# Patient Record
Sex: Male | Born: 1950 | Race: White | Hispanic: No | Marital: Married | Smoking: Never smoker
Health system: Southern US, Community
[De-identification: ages and names within clinical notes are randomized; demographics above are authoritative.]

## PROBLEM LIST (undated history)

## (undated) DIAGNOSIS — C439 Malignant melanoma of skin, unspecified: Secondary | ICD-10-CM

## (undated) DIAGNOSIS — R569 Unspecified convulsions: Secondary | ICD-10-CM

## (undated) DIAGNOSIS — K602 Anal fissure, unspecified: Secondary | ICD-10-CM

## (undated) DIAGNOSIS — E119 Type 2 diabetes mellitus without complications: Secondary | ICD-10-CM

## (undated) DIAGNOSIS — C801 Malignant (primary) neoplasm, unspecified: Secondary | ICD-10-CM

## (undated) DIAGNOSIS — M1990 Unspecified osteoarthritis, unspecified site: Secondary | ICD-10-CM

## (undated) DIAGNOSIS — I10 Essential (primary) hypertension: Secondary | ICD-10-CM

## (undated) DIAGNOSIS — E785 Hyperlipidemia, unspecified: Secondary | ICD-10-CM

## (undated) DIAGNOSIS — G40909 Epilepsy, unspecified, not intractable, without status epilepticus: Secondary | ICD-10-CM

## (undated) HISTORY — PX: OTHER SURGICAL HISTORY: SHX169

## (undated) HISTORY — PX: ANAL FISSURE REPAIR: SHX2312

## (undated) HISTORY — DX: Malignant melanoma of skin, unspecified: C43.9

---

## 2004-07-10 ENCOUNTER — Ambulatory Visit: Payer: Self-pay

## 2007-09-21 ENCOUNTER — Ambulatory Visit: Payer: Self-pay | Admitting: Internal Medicine

## 2013-08-13 DIAGNOSIS — G40909 Epilepsy, unspecified, not intractable, without status epilepticus: Secondary | ICD-10-CM | POA: Insufficient documentation

## 2013-08-13 DIAGNOSIS — E785 Hyperlipidemia, unspecified: Secondary | ICD-10-CM | POA: Insufficient documentation

## 2013-08-13 DIAGNOSIS — K603 Anal fistula: Secondary | ICD-10-CM | POA: Insufficient documentation

## 2013-08-13 DIAGNOSIS — I10 Essential (primary) hypertension: Secondary | ICD-10-CM | POA: Insufficient documentation

## 2013-08-13 DIAGNOSIS — D649 Anemia, unspecified: Secondary | ICD-10-CM | POA: Insufficient documentation

## 2014-06-27 DIAGNOSIS — K625 Hemorrhage of anus and rectum: Secondary | ICD-10-CM | POA: Diagnosis not present

## 2014-06-27 DIAGNOSIS — K603 Anal fistula: Secondary | ICD-10-CM | POA: Diagnosis not present

## 2014-06-28 DIAGNOSIS — E119 Type 2 diabetes mellitus without complications: Secondary | ICD-10-CM | POA: Diagnosis not present

## 2014-07-11 ENCOUNTER — Ambulatory Visit: Payer: Self-pay | Admitting: Surgery

## 2014-07-11 DIAGNOSIS — Z01812 Encounter for preprocedural laboratory examination: Secondary | ICD-10-CM | POA: Diagnosis not present

## 2014-07-11 DIAGNOSIS — I10 Essential (primary) hypertension: Secondary | ICD-10-CM | POA: Diagnosis not present

## 2014-07-11 DIAGNOSIS — Z01810 Encounter for preprocedural cardiovascular examination: Secondary | ICD-10-CM | POA: Diagnosis not present

## 2014-07-11 LAB — BASIC METABOLIC PANEL
Anion Gap: 7 (ref 7–16)
BUN: 20 mg/dL — ABNORMAL HIGH (ref 7–18)
CHLORIDE: 103 mmol/L (ref 98–107)
CO2: 28 mmol/L (ref 21–32)
CREATININE: 1.07 mg/dL (ref 0.60–1.30)
Calcium, Total: 8.9 mg/dL (ref 8.5–10.1)
EGFR (Non-African Amer.): >60
Glucose: 119 mg/dL — ABNORMAL HIGH (ref 65–99)
Osmolality: 279 (ref 275–301)
Potassium: 4 mmol/L (ref 3.5–5.1)
SODIUM: 138 mmol/L (ref 136–145)

## 2014-07-15 DIAGNOSIS — R7309 Other abnormal glucose: Secondary | ICD-10-CM | POA: Diagnosis not present

## 2014-07-15 DIAGNOSIS — R9431 Abnormal electrocardiogram [ECG] [EKG]: Secondary | ICD-10-CM | POA: Insufficient documentation

## 2014-07-15 DIAGNOSIS — R0602 Shortness of breath: Secondary | ICD-10-CM | POA: Diagnosis not present

## 2014-07-15 DIAGNOSIS — R079 Chest pain, unspecified: Secondary | ICD-10-CM | POA: Diagnosis not present

## 2014-07-15 DIAGNOSIS — I10 Essential (primary) hypertension: Secondary | ICD-10-CM | POA: Diagnosis not present

## 2014-07-19 ENCOUNTER — Ambulatory Visit: Payer: Self-pay | Admitting: Surgery

## 2014-07-19 DIAGNOSIS — Z79899 Other long term (current) drug therapy: Secondary | ICD-10-CM | POA: Diagnosis not present

## 2014-07-19 DIAGNOSIS — F419 Anxiety disorder, unspecified: Secondary | ICD-10-CM | POA: Diagnosis not present

## 2014-07-19 DIAGNOSIS — K621 Rectal polyp: Secondary | ICD-10-CM | POA: Diagnosis not present

## 2014-07-19 DIAGNOSIS — K605 Anorectal fistula: Secondary | ICD-10-CM | POA: Diagnosis not present

## 2014-07-19 DIAGNOSIS — E785 Hyperlipidemia, unspecified: Secondary | ICD-10-CM | POA: Diagnosis not present

## 2014-07-19 DIAGNOSIS — K603 Anal fistula: Secondary | ICD-10-CM | POA: Diagnosis not present

## 2014-07-19 DIAGNOSIS — K648 Other hemorrhoids: Secondary | ICD-10-CM | POA: Diagnosis not present

## 2014-07-19 DIAGNOSIS — E119 Type 2 diabetes mellitus without complications: Secondary | ICD-10-CM | POA: Diagnosis not present

## 2014-07-19 DIAGNOSIS — I10 Essential (primary) hypertension: Secondary | ICD-10-CM | POA: Diagnosis not present

## 2014-07-19 DIAGNOSIS — R569 Unspecified convulsions: Secondary | ICD-10-CM | POA: Diagnosis not present

## 2014-08-08 ENCOUNTER — Emergency Department: Payer: Self-pay | Admitting: Student

## 2014-08-08 DIAGNOSIS — M25511 Pain in right shoulder: Secondary | ICD-10-CM | POA: Diagnosis not present

## 2014-08-08 DIAGNOSIS — S82031A Displaced transverse fracture of right patella, initial encounter for closed fracture: Secondary | ICD-10-CM | POA: Diagnosis not present

## 2014-08-08 DIAGNOSIS — I10 Essential (primary) hypertension: Secondary | ICD-10-CM | POA: Diagnosis not present

## 2014-08-08 DIAGNOSIS — S42201A Unspecified fracture of upper end of right humerus, initial encounter for closed fracture: Secondary | ICD-10-CM | POA: Diagnosis not present

## 2014-08-08 DIAGNOSIS — K625 Hemorrhage of anus and rectum: Secondary | ICD-10-CM | POA: Diagnosis not present

## 2014-08-08 DIAGNOSIS — S4991XA Unspecified injury of right shoulder and upper arm, initial encounter: Secondary | ICD-10-CM | POA: Diagnosis not present

## 2014-08-08 DIAGNOSIS — S42291A Other displaced fracture of upper end of right humerus, initial encounter for closed fracture: Secondary | ICD-10-CM | POA: Diagnosis not present

## 2014-08-08 DIAGNOSIS — S82091A Other fracture of right patella, initial encounter for closed fracture: Secondary | ICD-10-CM | POA: Diagnosis not present

## 2014-08-08 DIAGNOSIS — L7621 Postprocedural hemorrhage and hematoma of skin and subcutaneous tissue following a dermatologic procedure: Secondary | ICD-10-CM | POA: Diagnosis not present

## 2014-08-08 DIAGNOSIS — G8911 Acute pain due to trauma: Secondary | ICD-10-CM | POA: Diagnosis not present

## 2014-08-08 DIAGNOSIS — K91840 Postprocedural hemorrhage and hematoma of a digestive system organ or structure following a digestive system procedure: Secondary | ICD-10-CM | POA: Diagnosis not present

## 2014-08-08 DIAGNOSIS — Z880 Allergy status to penicillin: Secondary | ICD-10-CM | POA: Diagnosis not present

## 2014-08-08 DIAGNOSIS — S82034A Nondisplaced transverse fracture of right patella, initial encounter for closed fracture: Secondary | ICD-10-CM | POA: Diagnosis not present

## 2014-08-08 DIAGNOSIS — E119 Type 2 diabetes mellitus without complications: Secondary | ICD-10-CM | POA: Diagnosis not present

## 2014-08-08 DIAGNOSIS — S42214A Unspecified nondisplaced fracture of surgical neck of right humerus, initial encounter for closed fracture: Secondary | ICD-10-CM | POA: Diagnosis not present

## 2014-08-08 DIAGNOSIS — S82001A Unspecified fracture of right patella, initial encounter for closed fracture: Secondary | ICD-10-CM | POA: Diagnosis not present

## 2014-08-08 DIAGNOSIS — M25561 Pain in right knee: Secondary | ICD-10-CM | POA: Diagnosis not present

## 2014-08-08 DIAGNOSIS — R9431 Abnormal electrocardiogram [ECG] [EKG]: Secondary | ICD-10-CM | POA: Diagnosis not present

## 2014-08-22 DIAGNOSIS — S82034D Nondisplaced transverse fracture of right patella, subsequent encounter for closed fracture with routine healing: Secondary | ICD-10-CM | POA: Diagnosis not present

## 2014-08-22 DIAGNOSIS — S42214D Unspecified nondisplaced fracture of surgical neck of right humerus, subsequent encounter for fracture with routine healing: Secondary | ICD-10-CM | POA: Diagnosis not present

## 2014-09-06 DIAGNOSIS — S42214D Unspecified nondisplaced fracture of surgical neck of right humerus, subsequent encounter for fracture with routine healing: Secondary | ICD-10-CM | POA: Diagnosis not present

## 2014-09-06 DIAGNOSIS — S82034D Nondisplaced transverse fracture of right patella, subsequent encounter for closed fracture with routine healing: Secondary | ICD-10-CM | POA: Diagnosis not present

## 2014-09-26 DIAGNOSIS — T161XXA Foreign body in right ear, initial encounter: Secondary | ICD-10-CM | POA: Diagnosis not present

## 2014-09-26 DIAGNOSIS — J301 Allergic rhinitis due to pollen: Secondary | ICD-10-CM | POA: Diagnosis not present

## 2014-09-27 DIAGNOSIS — Z125 Encounter for screening for malignant neoplasm of prostate: Secondary | ICD-10-CM | POA: Diagnosis not present

## 2014-09-27 DIAGNOSIS — G40909 Epilepsy, unspecified, not intractable, without status epilepticus: Secondary | ICD-10-CM | POA: Diagnosis not present

## 2014-09-27 DIAGNOSIS — R7309 Other abnormal glucose: Secondary | ICD-10-CM | POA: Diagnosis not present

## 2014-09-27 DIAGNOSIS — E780 Pure hypercholesterolemia: Secondary | ICD-10-CM | POA: Diagnosis not present

## 2014-09-27 DIAGNOSIS — I10 Essential (primary) hypertension: Secondary | ICD-10-CM | POA: Diagnosis not present

## 2014-09-27 DIAGNOSIS — Z79899 Other long term (current) drug therapy: Secondary | ICD-10-CM | POA: Diagnosis not present

## 2014-10-03 DIAGNOSIS — K603 Anal fistula: Secondary | ICD-10-CM | POA: Diagnosis not present
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
## 2014-10-13 NOTE — Op Note (Signed)
PATIENT NAME:  Daniel Hansen, Daniel Hansen MR#:  [id number] DATE OF BIRTH:  06/21/1950  DATE OF PROCEDURE:  07/19/2014  PREOPERATIVE DIAGNOSIS: Fistula in ano.   POSTOPERATIVE DIAGNOSIS: Fistula in ano.   PROCEDURE: Repair of fistula in ano.   SURGEON: Loreli Dollar, MD   ANESTHESIA: General.   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 There was a finding of an opening externally, which was about 4 cm from the anal os on the patient's left side and posterior to the anal os, and this did have an indwelling Silastic catheter.   The patient was placed on the operating table in the supine position under general anesthesia. Legs were elevated into the lithotomy position. The scrotum was retracted with paper tape. The anal area was prepared with Betadine solution and draped in a sterile manner. The opening was noted on the patient's left side, posterior to the anal os, 4 cm from the anal os. The anal canal was moderately stenotic and 2 fingers were introduced, gently dilating the anal canal. The bivalve anal retractor was introduced and demonstrated location of the fistula, which was posterior and slightly to the left of the midline, at the upper extent of the anal canal. There appeared to be 2 openings in the mucosa at this site. Also, I rotated the bivalve anal retractor. It did demonstrate moderate enlargement of internal hemorrhoids. One small benign-appearing pedunculated polyp was smoothed and was excised, which was adjacent to the internal opening. Next, the probe was inserted, and it appeared that it would go into the fistula beside the Silastic catheter and come out into the internal opening. Next, the probe was removed and the track was irrigated initially with saline solution and then copiously irrigated with peroxide solution, and I did not see any stool  or particulate matter come out, as it appeared to be clean There were 2 openings on the inside, which were just about 8 mm apart. Next, the probe was inserted and tied a 0 silk around it and pulled the silk through and then removed the Silastic catheter. Next, the [place] was used to tie  it to the [place] plug, which is inserted through the internal opening and pulled through until the large end of the plug was flush with the mucosa. Next, a 2-0 Vicryl suture was used to close the internal opening of the fistula, with each suture going through the wide end of the fistula plug. Also, the opening just above it was closed in a similar manner with figure-of-eight 2-0 Vicryl sutures. Next, the external portion of the plug was cut flush with the skin. There was some granulation tissue, which was oozing and was cauterized. The site was infiltrated with 1% Xylocaine with epinephrine for hemostasis, and the external opening was left open for drainage.   The patient appeared to be in satisfactory condition. Dressings were applied with paper tape, and the patient was prepared for transfer to the recovery room.    ____________________________ Lenna Sciara. Rochel Brome, MD jws:mw D: 07/19/2014 11:25:16 ET T: 07/19/2014 12:52:24 ET JOB#: [id number]  cc: Loreli Dollar, MD, <Dictator> Loreli Dollar MD ELECTRONICALLY SIGNED 07/25/2014 15:20

## 2014-10-13 NOTE — Consult Note (Signed)
PATIENT NAME:  Daniel Hansen, Daniel Hansen MR#:  [id number] DATE OF BIRTH:  [birth year]  DATE OF CONSULTATION:  08/08/2014  REFERRING PHYSICIAN:   CONSULTING PHYSICIAN:  Loreli Dollar, MD  HISTORY OF PRESENT ILLNESS: This 64 year old male came into the Emergency Room for treatment of a right humerus fracture and right patella fracture; however, also has a complaint of bleeding from an opening in the skin left of the anal os. He has a history of fistula in ano, which dates back more than a year. One year ago, he was found to have a retrorectal sinus tract. He was referred to colorectal surgeon at [facility] to see Dr. Kem Parkinson who inserted a seton which was Silastic. That seton remained in until just recently. He also learned that his insurance would not let him return to [facility] and so he came to my office. We elected to attempt a plug fistula repair which was recently done as outpatient surgery inserting a Cook fistula plug. It is noted that a number of days after surgery the wife saw something come out of the external opening, which may have been the plug. He was further examined in the office and did continue to have findings of a retrorectal sinus tract in that a probe could be inserted some 12 to 15 cm in the retrorectal space.  Today, he had some bleeding from that sinus tract with bright red blood coming out of the opening, which is about 4 cm from the anal os on the patient's left side. It sounds like it was a fairly significant amount of blood, but no blood came out of the rectum.   It is further noted that he is being admitted to the hospital due to a right humerus fracture and right patella fracture.   PHYSICAL EXAMINATION:  VITAL SIGNS: His temperature is 98.6, pulse 65, respirations 18, blood pressure 121/61.  GENERAL: He is awake, alert, and oriented on the ER stretcher.  SKIN: It was possible to roll him up on the left side and examined the anal area. There was a small opening in the skin which was left  of the anal os approximately 4 cm away. There was no current bleeding. Digital examination demonstrates no palpable mass and no unusual tenderness.  LABORATORY DATA: His hemoglobin is 13.2, white blood count 13,200.   IMPRESSION: Chronic retrorectal sinus tract.   He is using his pull ups as dressing.   I have discussed with him [facility] consultation and my office is working to make arrangements for [facility] consultation.  Plan at present to continue to monitor.  ____________________________ J. Rochel Brome, MD jws:am D: 08/08/2014 18:07:36 ET T: 08/08/2014 23:53:01 ET JOB#: [id number]  cc: Loreli Dollar, MD, <Dictator> Loreli Dollar MD ELECTRONICALLY SIGNED 08/16/2014 10:05

## 2014-10-23 DIAGNOSIS — B372 Candidiasis of skin and nail: Secondary | ICD-10-CM | POA: Diagnosis not present

## 2014-10-23 DIAGNOSIS — K603 Anal fistula: Secondary | ICD-10-CM | POA: Diagnosis not present

## 2014-10-25 DIAGNOSIS — M1990 Unspecified osteoarthritis, unspecified site: Secondary | ICD-10-CM | POA: Diagnosis not present

## 2014-10-25 DIAGNOSIS — E119 Type 2 diabetes mellitus without complications: Secondary | ICD-10-CM | POA: Diagnosis not present

## 2014-10-25 DIAGNOSIS — K605 Anorectal fistula: Secondary | ICD-10-CM | POA: Diagnosis not present

## 2014-10-25 DIAGNOSIS — I10 Essential (primary) hypertension: Secondary | ICD-10-CM | POA: Diagnosis not present

## 2014-10-25 DIAGNOSIS — G40909 Epilepsy, unspecified, not intractable, without status epilepticus: Secondary | ICD-10-CM | POA: Diagnosis not present

## 2014-10-25 DIAGNOSIS — H9190 Unspecified hearing loss, unspecified ear: Secondary | ICD-10-CM | POA: Diagnosis not present

## 2014-10-25 DIAGNOSIS — E785 Hyperlipidemia, unspecified: Secondary | ICD-10-CM | POA: Diagnosis not present

## 2014-10-25 DIAGNOSIS — K603 Anal fistula: Secondary | ICD-10-CM | POA: Diagnosis not present

## 2014-12-31 DIAGNOSIS — K603 Anal fistula: Secondary | ICD-10-CM | POA: Diagnosis not present

## 2015-01-13 DIAGNOSIS — R197 Diarrhea, unspecified: Secondary | ICD-10-CM | POA: Diagnosis not present

## 2015-01-15 DIAGNOSIS — H93293 Other abnormal auditory perceptions, bilateral: Secondary | ICD-10-CM | POA: Diagnosis not present

## 2015-01-15 DIAGNOSIS — H6123 Impacted cerumen, bilateral: Secondary | ICD-10-CM | POA: Diagnosis not present

## 2015-03-25 DIAGNOSIS — R7309 Other abnormal glucose: Secondary | ICD-10-CM | POA: Diagnosis not present

## 2015-03-25 DIAGNOSIS — E7800 Pure hypercholesterolemia, unspecified: Secondary | ICD-10-CM | POA: Diagnosis not present

## 2015-03-25 DIAGNOSIS — Z79899 Other long term (current) drug therapy: Secondary | ICD-10-CM | POA: Diagnosis not present

## 2015-03-25 DIAGNOSIS — I10 Essential (primary) hypertension: Secondary | ICD-10-CM | POA: Diagnosis not present

## 2015-03-25 DIAGNOSIS — Z1211 Encounter for screening for malignant neoplasm of colon: Secondary | ICD-10-CM | POA: Diagnosis not present

## 2015-03-25 DIAGNOSIS — Z8669 Personal history of other diseases of the nervous system and sense organs: Secondary | ICD-10-CM | POA: Diagnosis not present

## 2015-03-25 DIAGNOSIS — G40909 Epilepsy, unspecified, not intractable, without status epilepticus: Secondary | ICD-10-CM | POA: Diagnosis not present

## 2015-03-25 DIAGNOSIS — Z0000 Encounter for general adult medical examination without abnormal findings: Secondary | ICD-10-CM | POA: Diagnosis not present

## 2015-03-26 DIAGNOSIS — Z1211 Encounter for screening for malignant neoplasm of colon: Secondary | ICD-10-CM | POA: Diagnosis not present

## 2015-07-29 DIAGNOSIS — K603 Anal fistula: Secondary | ICD-10-CM | POA: Diagnosis not present

## 2015-10-03 DIAGNOSIS — Z79899 Other long term (current) drug therapy: Secondary | ICD-10-CM | POA: Diagnosis not present

## 2015-10-03 DIAGNOSIS — E7800 Pure hypercholesterolemia, unspecified: Secondary | ICD-10-CM | POA: Diagnosis not present

## 2015-10-03 DIAGNOSIS — G40909 Epilepsy, unspecified, not intractable, without status epilepticus: Secondary | ICD-10-CM | POA: Diagnosis not present

## 2015-10-03 DIAGNOSIS — I10 Essential (primary) hypertension: Secondary | ICD-10-CM | POA: Diagnosis not present

## 2015-10-03 DIAGNOSIS — Z125 Encounter for screening for malignant neoplasm of prostate: Secondary | ICD-10-CM | POA: Diagnosis not present

## 2015-10-03 DIAGNOSIS — R7309 Other abnormal glucose: Secondary | ICD-10-CM | POA: Diagnosis not present

## 2016-01-23 DIAGNOSIS — E119 Type 2 diabetes mellitus without complications: Secondary | ICD-10-CM | POA: Diagnosis not present

## 2016-03-26 DIAGNOSIS — R7309 Other abnormal glucose: Secondary | ICD-10-CM | POA: Diagnosis not present

## 2016-03-26 DIAGNOSIS — G40909 Epilepsy, unspecified, not intractable, without status epilepticus: Secondary | ICD-10-CM | POA: Diagnosis not present

## 2016-03-26 DIAGNOSIS — E7800 Pure hypercholesterolemia, unspecified: Secondary | ICD-10-CM | POA: Diagnosis not present

## 2016-03-26 DIAGNOSIS — Z0000 Encounter for general adult medical examination without abnormal findings: Secondary | ICD-10-CM | POA: Diagnosis not present

## 2016-03-26 DIAGNOSIS — Z79899 Other long term (current) drug therapy: Secondary | ICD-10-CM | POA: Diagnosis not present

## 2016-03-26 DIAGNOSIS — Z1211 Encounter for screening for malignant neoplasm of colon: Secondary | ICD-10-CM | POA: Diagnosis not present

## 2016-03-26 DIAGNOSIS — I10 Essential (primary) hypertension: Secondary | ICD-10-CM | POA: Diagnosis not present

## 2016-03-29 DIAGNOSIS — Z1211 Encounter for screening for malignant neoplasm of colon: Secondary | ICD-10-CM | POA: Diagnosis not present

## 2016-09-27 DIAGNOSIS — G40909 Epilepsy, unspecified, not intractable, without status epilepticus: Secondary | ICD-10-CM | POA: Diagnosis not present

## 2016-09-27 DIAGNOSIS — Z79899 Other long term (current) drug therapy: Secondary | ICD-10-CM | POA: Diagnosis not present

## 2016-09-27 DIAGNOSIS — R7309 Other abnormal glucose: Secondary | ICD-10-CM | POA: Diagnosis not present

## 2016-09-27 DIAGNOSIS — I10 Essential (primary) hypertension: Secondary | ICD-10-CM | POA: Diagnosis not present

## 2016-09-27 DIAGNOSIS — E7800 Pure hypercholesterolemia, unspecified: Secondary | ICD-10-CM | POA: Diagnosis not present

## 2016-09-27 DIAGNOSIS — Z125 Encounter for screening for malignant neoplasm of prostate: Secondary | ICD-10-CM | POA: Diagnosis not present

## 2016-11-09 DIAGNOSIS — K603 Anal fistula: Secondary | ICD-10-CM | POA: Diagnosis not present

## 2016-11-22 DIAGNOSIS — K610 Anal abscess: Secondary | ICD-10-CM | POA: Diagnosis not present

## 2016-11-22 DIAGNOSIS — Z7984 Long term (current) use of oral hypoglycemic drugs: Secondary | ICD-10-CM | POA: Diagnosis not present

## 2016-11-22 DIAGNOSIS — K603 Anal fistula: Secondary | ICD-10-CM | POA: Diagnosis not present

## 2016-11-22 DIAGNOSIS — E119 Type 2 diabetes mellitus without complications: Secondary | ICD-10-CM | POA: Diagnosis not present

## 2016-11-22 DIAGNOSIS — Z79899 Other long term (current) drug therapy: Secondary | ICD-10-CM | POA: Diagnosis not present

## 2016-11-30 DIAGNOSIS — Z09 Encounter for follow-up examination after completed treatment for conditions other than malignant neoplasm: Secondary | ICD-10-CM | POA: Diagnosis not present

## 2017-02-22 IMAGING — CR DG SHOULDER 3+V*R*
1 series · 3 of 3 positions shown · non-contrast
Comparison: None.

CLINICAL DATA: Fall.  Trauma.  Proximal RIGHT humerus fracture.

EXAM:
DG SHOULDER 3+ VIEWS RIGHT

[Series 1: dxr shoulder right complete · 0.14mm/px · 3 of 3 slices shown]
[im 1/3]
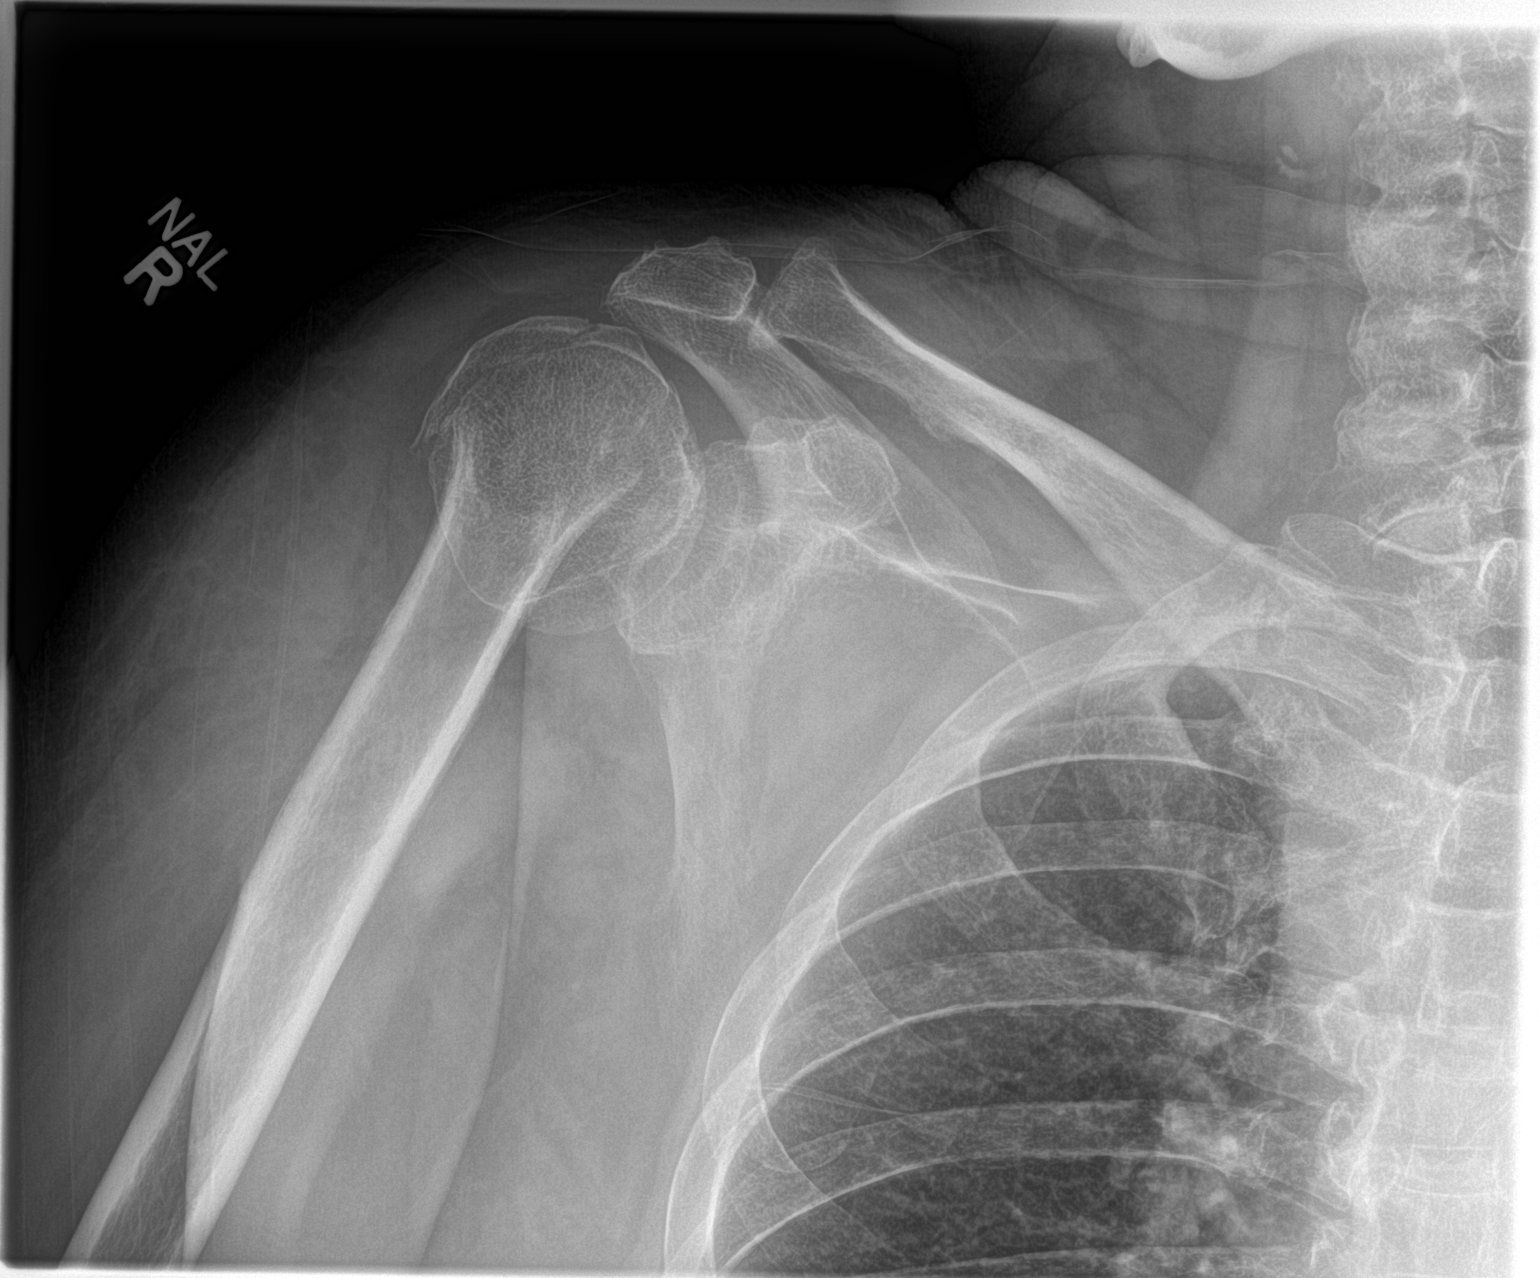
[im 2/3]
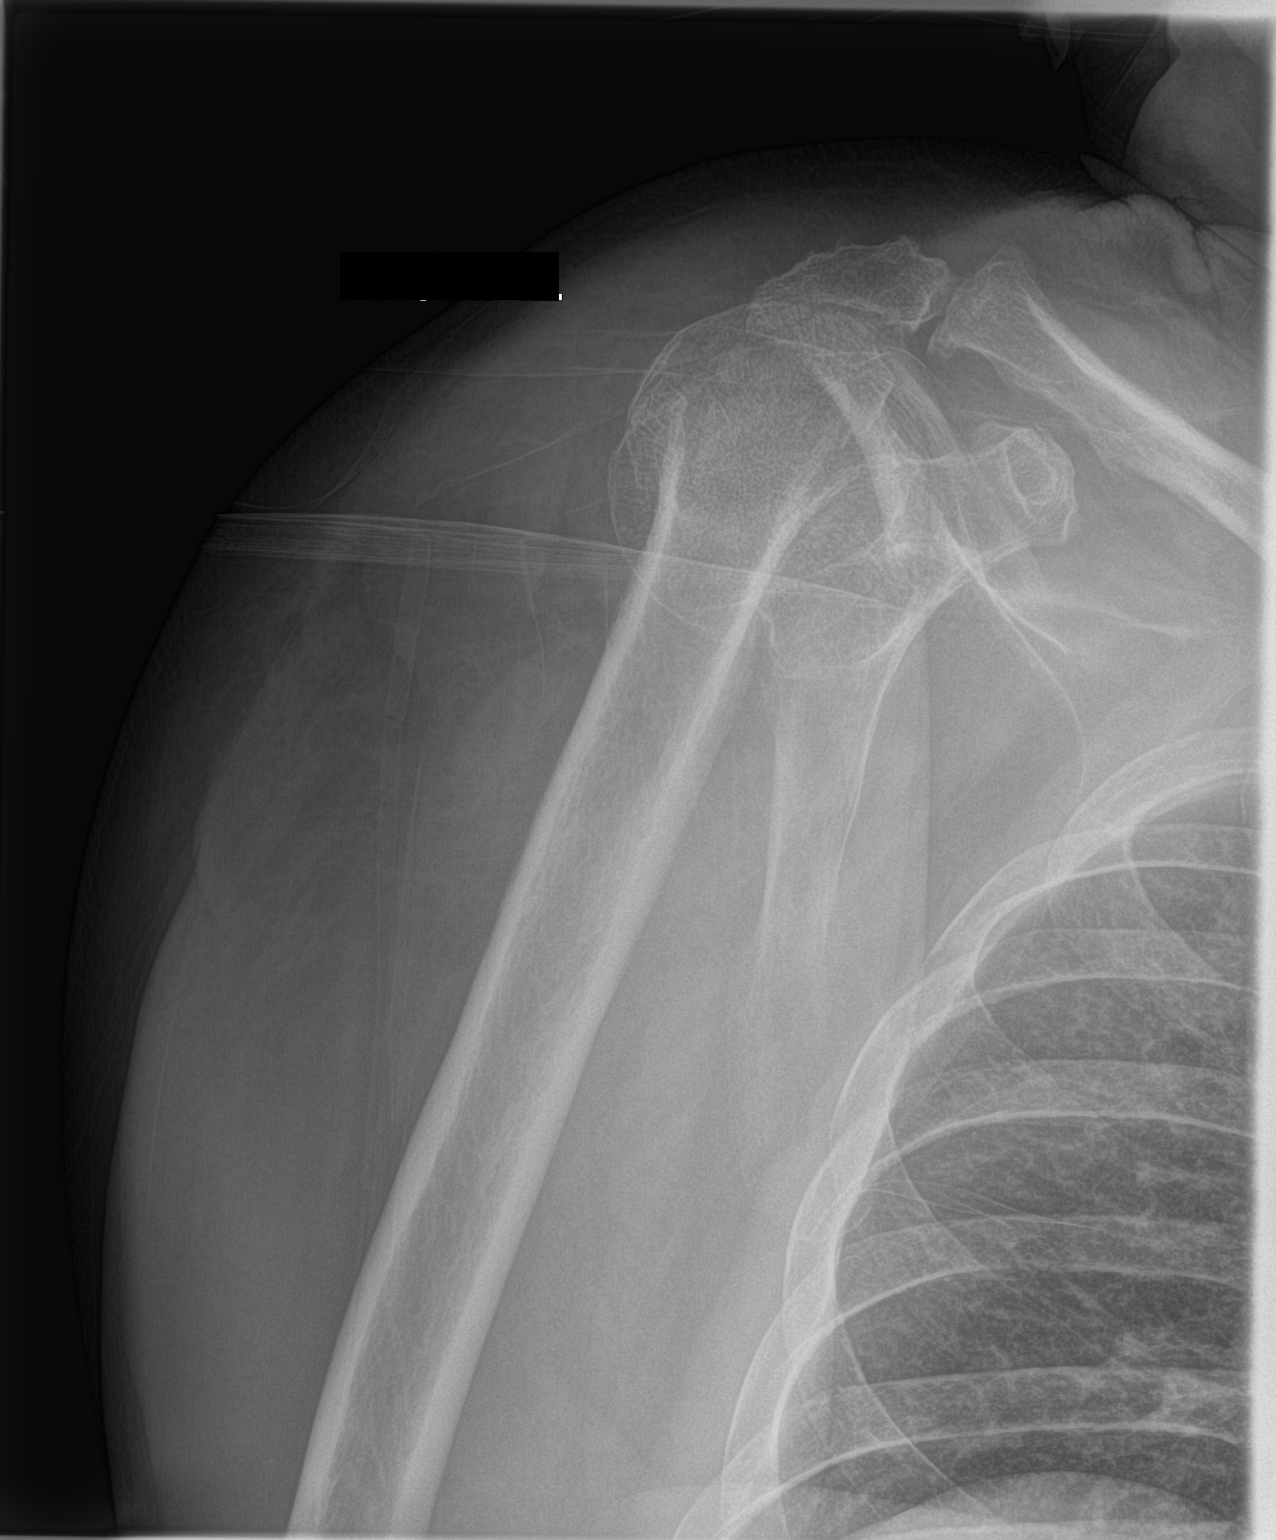
[im 3/3]
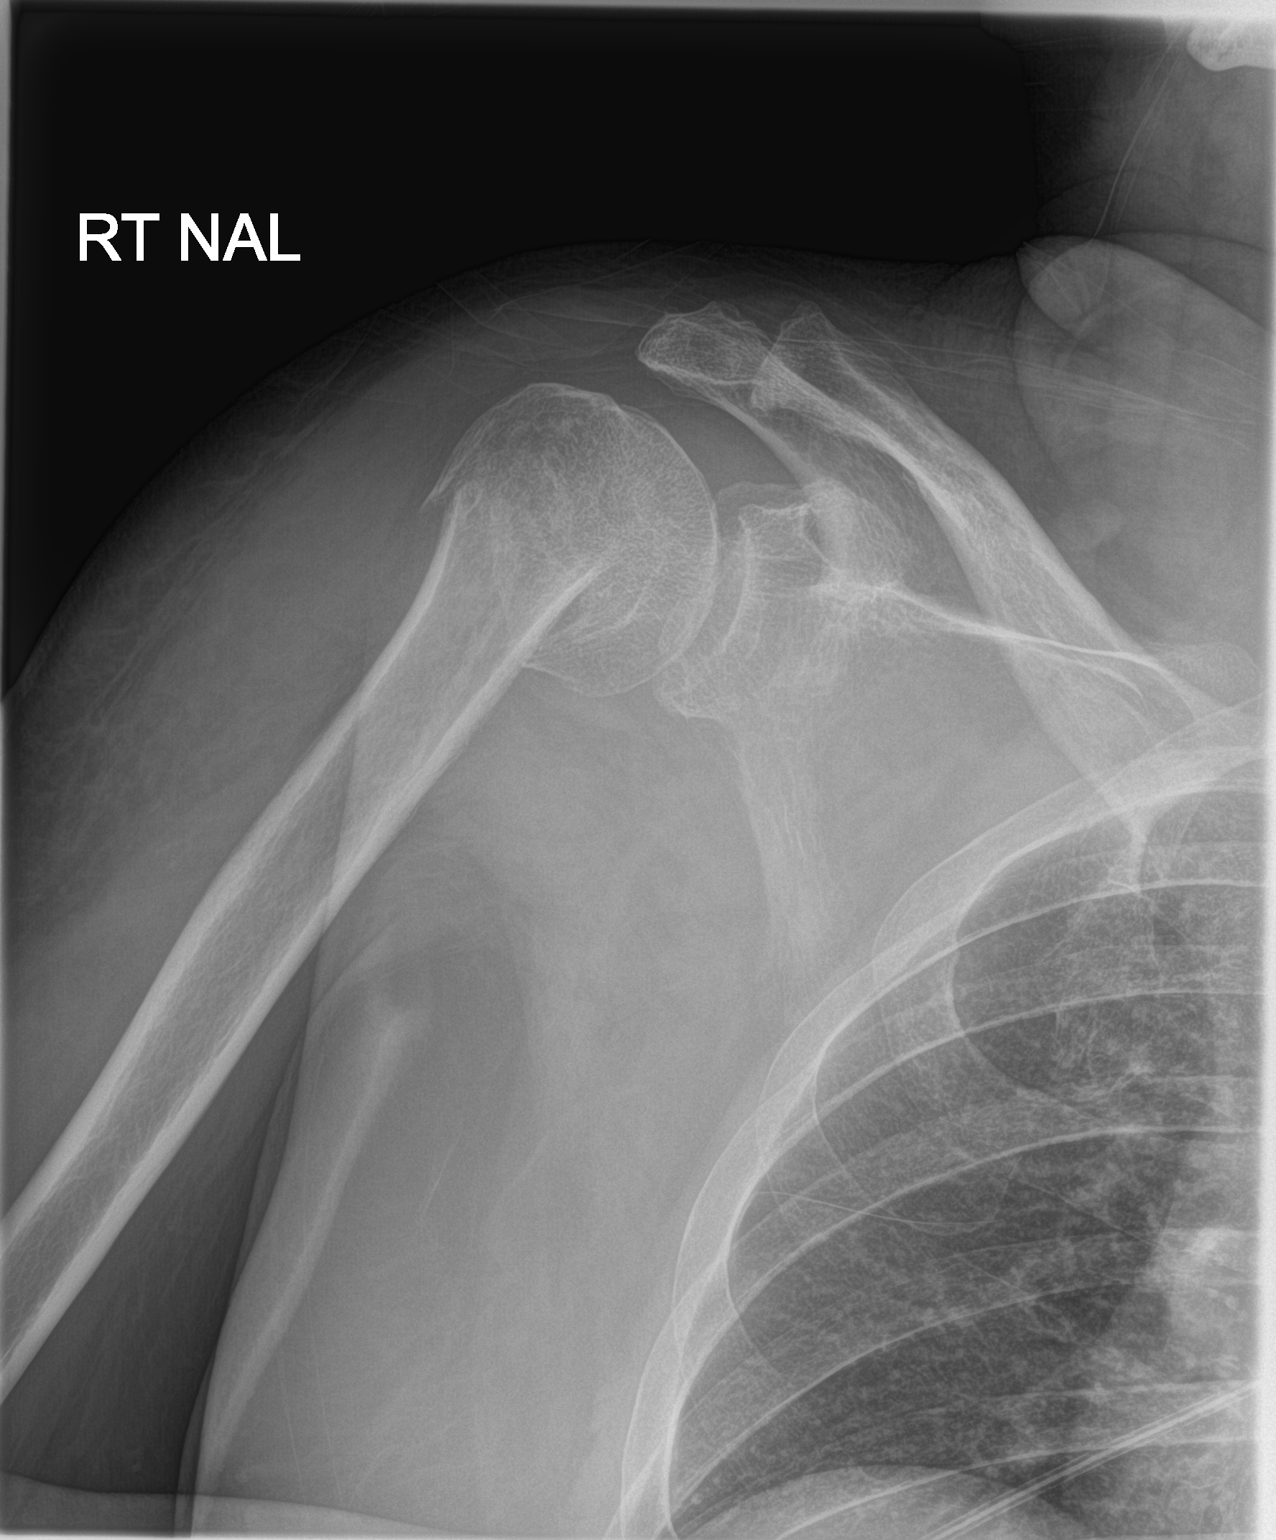

[3 of 3 positions shown; findings below may reference images not displayed]

FINDINGS: There is a proximal humerus fracture. This is mildly impacted with
shortening of the proximal humerus. This is probably a multipart
fracture with fracture running transversely through the metaphysis.
Greater tuberosity at fracture is evident on the frontal view. This
is at least did 2 part fracture with impaction of the proximal
metaphysis of at least 2.1 cm. Humeral head remains located. Severe
AC joint osteoarthritis.
IMPRESSION: At least 2 part proximal RIGHT humerus fracture.

## 2017-02-22 IMAGING — CR DG KNEE COMPLETE 4+V*R*
1 series · 4 of 4 positions shown · non-contrast
Comparison: None

CLINICAL DATA: Fell yesterday going into house landing on his knee,
RIGHT knee pain

EXAM:
RIGHT KNEE - COMPLETE 4+ VIEW

[Series 1: dxr knee rt comp with obliques · 0.14mm/px · 4 of 4 slices shown]
[im 1/4]
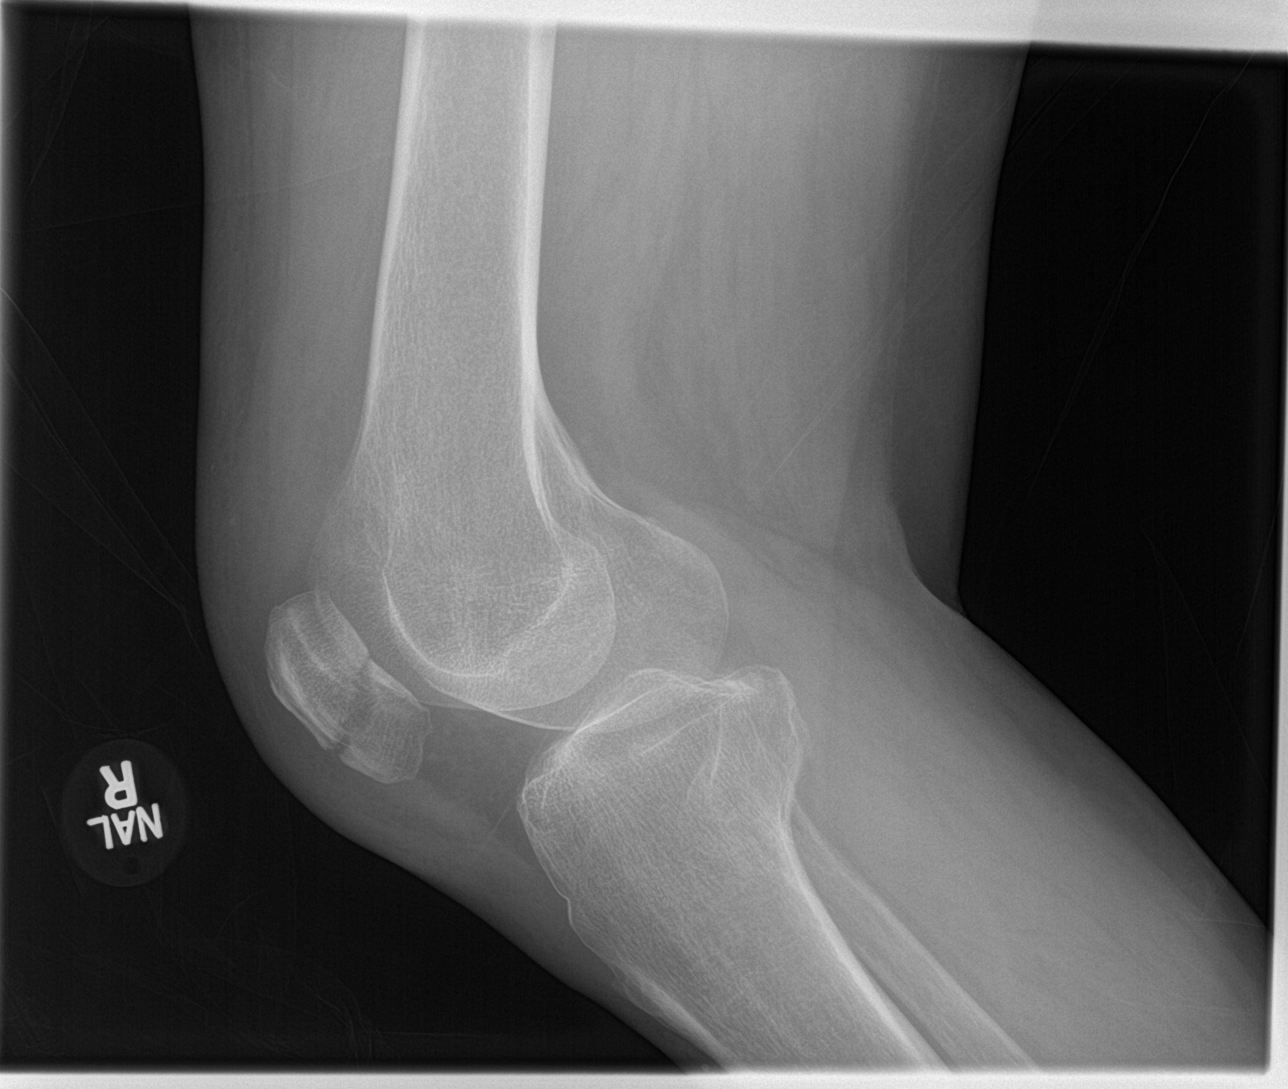
[im 2/4]
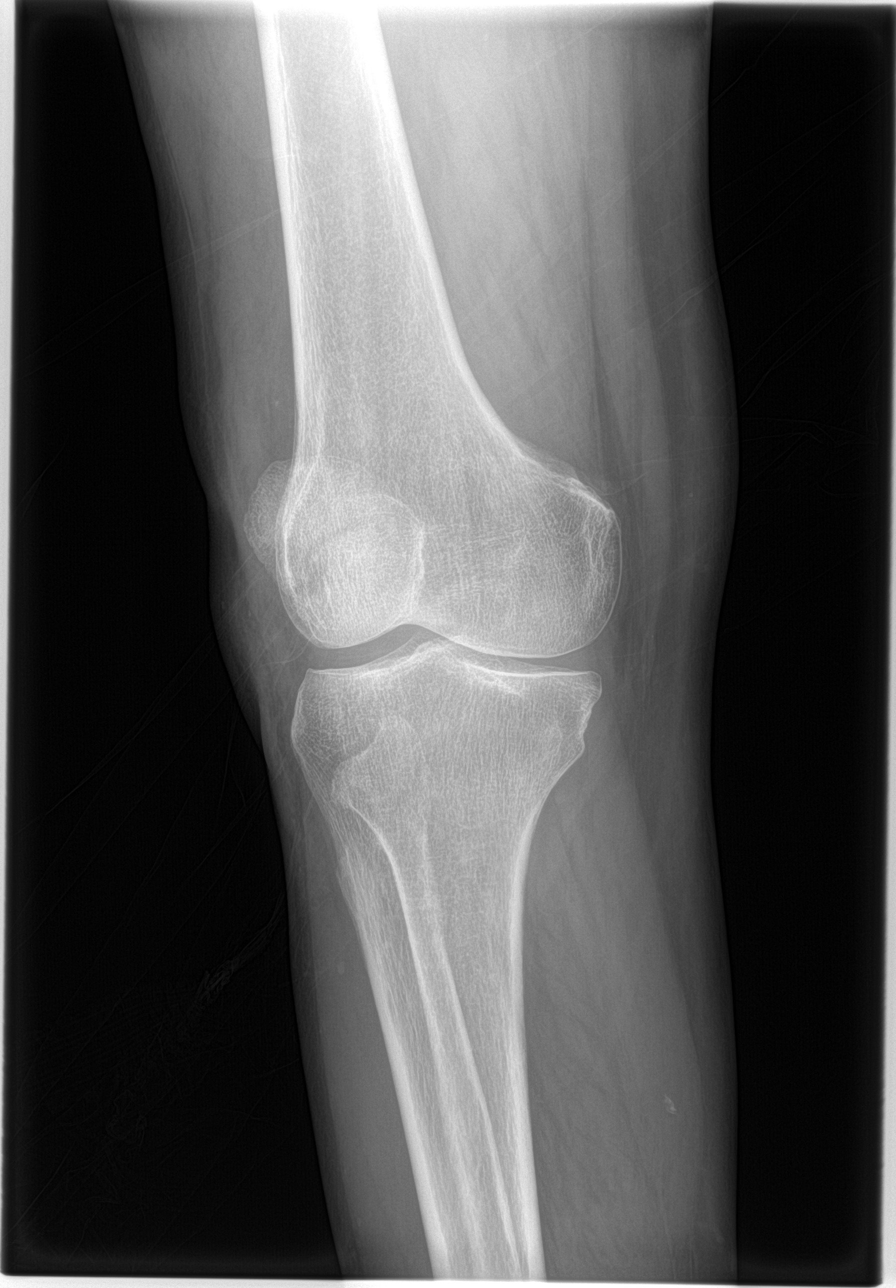
[im 3/4]
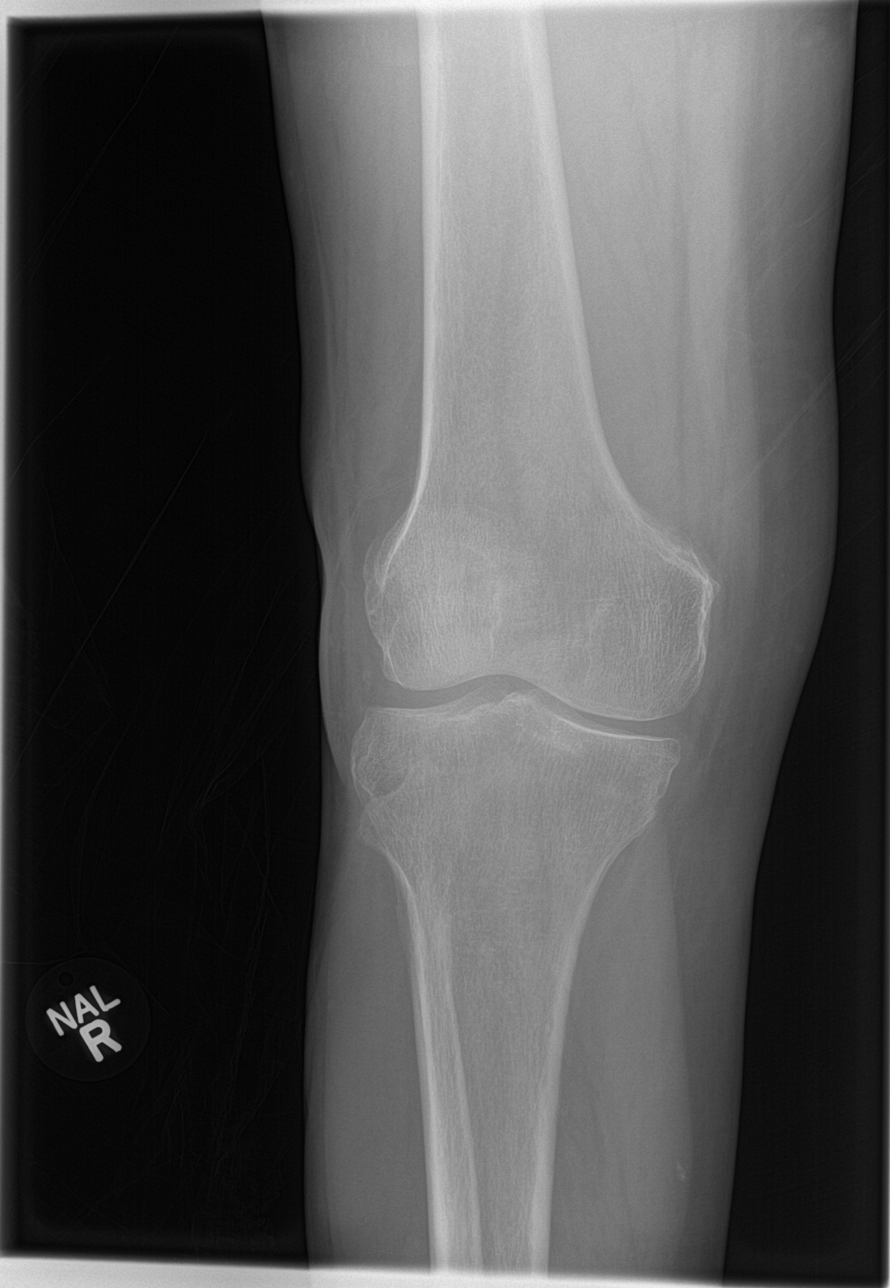
[im 4/4]
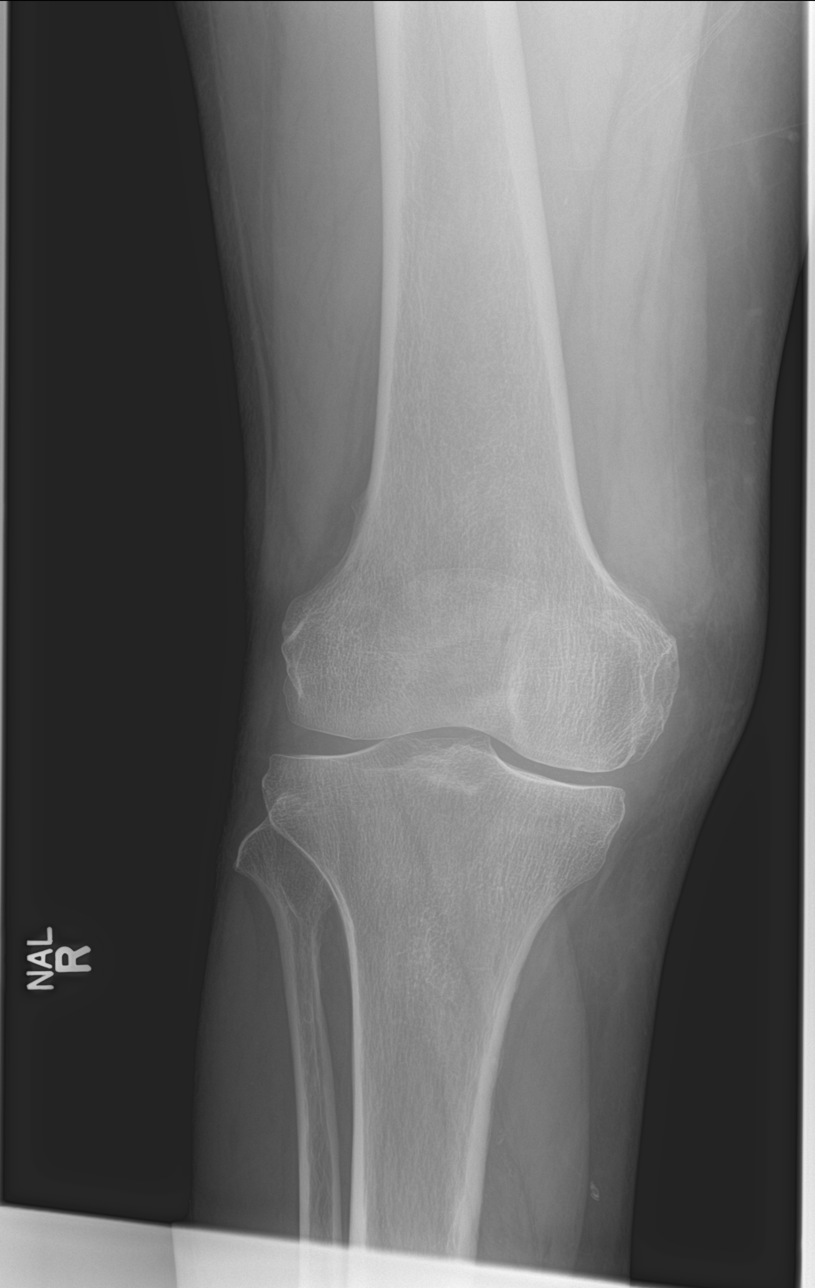

[4 of 4 positions shown; findings below may reference images not displayed]

FINDINGS: Diffuse osseous demineralization.

Mild medial compartment joint space narrowing.

Remaining joint spaces preserved.

Oblique fracture patella, minimally distracted.

Associated soft tissue swelling and minimal joint effusion.

No additional fracture, dislocation, or bone destruction.
IMPRESSION: Osseous demineralization.

Oblique patellar fracture, minimally distracted.

## 2017-03-14 DIAGNOSIS — H903 Sensorineural hearing loss, bilateral: Secondary | ICD-10-CM | POA: Diagnosis not present

## 2017-03-14 DIAGNOSIS — Z8669 Personal history of other diseases of the nervous system and sense organs: Secondary | ICD-10-CM | POA: Diagnosis not present

## 2017-03-14 DIAGNOSIS — R42 Dizziness and giddiness: Secondary | ICD-10-CM | POA: Diagnosis not present

## 2017-03-14 DIAGNOSIS — H8112 Benign paroxysmal vertigo, left ear: Secondary | ICD-10-CM | POA: Diagnosis not present

## 2017-03-28 DIAGNOSIS — Z79899 Other long term (current) drug therapy: Secondary | ICD-10-CM | POA: Diagnosis not present

## 2017-03-28 DIAGNOSIS — R7309 Other abnormal glucose: Secondary | ICD-10-CM | POA: Diagnosis not present

## 2017-03-28 DIAGNOSIS — Z1211 Encounter for screening for malignant neoplasm of colon: Secondary | ICD-10-CM | POA: Diagnosis not present

## 2017-03-28 DIAGNOSIS — R945 Abnormal results of liver function studies: Secondary | ICD-10-CM | POA: Diagnosis not present

## 2017-03-28 DIAGNOSIS — G40909 Epilepsy, unspecified, not intractable, without status epilepticus: Secondary | ICD-10-CM | POA: Diagnosis not present

## 2017-03-28 DIAGNOSIS — R8290 Unspecified abnormal findings in urine: Secondary | ICD-10-CM | POA: Diagnosis not present

## 2017-03-28 DIAGNOSIS — E7800 Pure hypercholesterolemia, unspecified: Secondary | ICD-10-CM | POA: Diagnosis not present

## 2017-03-28 DIAGNOSIS — I10 Essential (primary) hypertension: Secondary | ICD-10-CM | POA: Diagnosis not present

## 2017-03-28 DIAGNOSIS — Z0000 Encounter for general adult medical examination without abnormal findings: Secondary | ICD-10-CM | POA: Diagnosis not present

## 2017-03-29 DIAGNOSIS — R8290 Unspecified abnormal findings in urine: Secondary | ICD-10-CM | POA: Diagnosis not present

## 2017-03-30 DIAGNOSIS — Z1211 Encounter for screening for malignant neoplasm of colon: Secondary | ICD-10-CM | POA: Diagnosis not present

## 2017-09-27 DIAGNOSIS — I10 Essential (primary) hypertension: Secondary | ICD-10-CM | POA: Diagnosis not present

## 2017-09-27 DIAGNOSIS — Z125 Encounter for screening for malignant neoplasm of prostate: Secondary | ICD-10-CM | POA: Diagnosis not present

## 2017-09-27 DIAGNOSIS — R7309 Other abnormal glucose: Secondary | ICD-10-CM | POA: Diagnosis not present

## 2017-09-27 DIAGNOSIS — Z79899 Other long term (current) drug therapy: Secondary | ICD-10-CM | POA: Diagnosis not present

## 2017-09-27 DIAGNOSIS — E7800 Pure hypercholesterolemia, unspecified: Secondary | ICD-10-CM | POA: Diagnosis not present

## 2017-09-27 DIAGNOSIS — G40909 Epilepsy, unspecified, not intractable, without status epilepticus: Secondary | ICD-10-CM | POA: Diagnosis not present

## 2017-11-15 DIAGNOSIS — H9201 Otalgia, right ear: Secondary | ICD-10-CM | POA: Diagnosis not present

## 2017-11-15 DIAGNOSIS — H60331 Swimmer's ear, right ear: Secondary | ICD-10-CM | POA: Diagnosis not present

## 2017-11-15 DIAGNOSIS — H9212 Otorrhea, left ear: Secondary | ICD-10-CM | POA: Diagnosis not present

## 2017-11-25 DIAGNOSIS — H9212 Otorrhea, left ear: Secondary | ICD-10-CM | POA: Diagnosis not present

## 2017-11-25 DIAGNOSIS — H60331 Swimmer's ear, right ear: Secondary | ICD-10-CM | POA: Diagnosis not present

## 2017-12-20 DIAGNOSIS — H9201 Otalgia, right ear: Secondary | ICD-10-CM | POA: Diagnosis not present

## 2018-01-14 DIAGNOSIS — H60501 Unspecified acute noninfective otitis externa, right ear: Secondary | ICD-10-CM | POA: Diagnosis not present

## 2018-03-29 DIAGNOSIS — E782 Mixed hyperlipidemia: Secondary | ICD-10-CM | POA: Diagnosis not present

## 2018-03-29 DIAGNOSIS — I10 Essential (primary) hypertension: Secondary | ICD-10-CM | POA: Diagnosis not present

## 2018-03-29 DIAGNOSIS — R8290 Unspecified abnormal findings in urine: Secondary | ICD-10-CM | POA: Diagnosis not present

## 2018-03-29 DIAGNOSIS — Z8669 Personal history of other diseases of the nervous system and sense organs: Secondary | ICD-10-CM | POA: Diagnosis not present

## 2018-03-29 DIAGNOSIS — R7309 Other abnormal glucose: Secondary | ICD-10-CM | POA: Diagnosis not present

## 2018-03-29 DIAGNOSIS — E119 Type 2 diabetes mellitus without complications: Secondary | ICD-10-CM | POA: Diagnosis not present

## 2018-03-29 DIAGNOSIS — E7800 Pure hypercholesterolemia, unspecified: Secondary | ICD-10-CM | POA: Diagnosis not present

## 2018-03-29 DIAGNOSIS — Z79899 Other long term (current) drug therapy: Secondary | ICD-10-CM | POA: Diagnosis not present

## 2018-03-29 DIAGNOSIS — G40909 Epilepsy, unspecified, not intractable, without status epilepticus: Secondary | ICD-10-CM | POA: Diagnosis not present

## 2018-03-29 DIAGNOSIS — Z0000 Encounter for general adult medical examination without abnormal findings: Secondary | ICD-10-CM | POA: Diagnosis not present

## 2018-03-30 DIAGNOSIS — Z1211 Encounter for screening for malignant neoplasm of colon: Secondary | ICD-10-CM | POA: Diagnosis not present

## 2018-04-10 DIAGNOSIS — R195 Other fecal abnormalities: Secondary | ICD-10-CM | POA: Diagnosis not present

## 2018-04-28 DIAGNOSIS — H93299 Other abnormal auditory perceptions, unspecified ear: Secondary | ICD-10-CM | POA: Diagnosis not present

## 2018-04-28 DIAGNOSIS — H6123 Impacted cerumen, bilateral: Secondary | ICD-10-CM | POA: Diagnosis not present

## 2018-08-15 ENCOUNTER — Encounter: Payer: Self-pay | Admitting: Podiatry

## 2018-08-15 ENCOUNTER — Other Ambulatory Visit: Payer: Self-pay | Admitting: Podiatry

## 2018-08-15 ENCOUNTER — Ambulatory Visit: Payer: Medicare HMO | Admitting: Podiatry

## 2018-08-15 ENCOUNTER — Ambulatory Visit (INDEPENDENT_AMBULATORY_CARE_PROVIDER_SITE_OTHER): Payer: Medicare HMO

## 2018-08-15 DIAGNOSIS — E669 Obesity, unspecified: Secondary | ICD-10-CM | POA: Insufficient documentation

## 2018-08-15 DIAGNOSIS — M7751 Other enthesopathy of right foot: Secondary | ICD-10-CM

## 2018-08-15 DIAGNOSIS — M779 Enthesopathy, unspecified: Secondary | ICD-10-CM

## 2018-08-15 DIAGNOSIS — H9190 Unspecified hearing loss, unspecified ear: Secondary | ICD-10-CM | POA: Insufficient documentation

## 2018-08-15 DIAGNOSIS — C4490 Unspecified malignant neoplasm of skin, unspecified: Secondary | ICD-10-CM | POA: Insufficient documentation

## 2018-08-15 DIAGNOSIS — E119 Type 2 diabetes mellitus without complications: Secondary | ICD-10-CM

## 2018-08-15 DIAGNOSIS — I878 Other specified disorders of veins: Secondary | ICD-10-CM | POA: Insufficient documentation

## 2018-08-15 DIAGNOSIS — Z9109 Other allergy status, other than to drugs and biological substances: Secondary | ICD-10-CM | POA: Insufficient documentation

## 2018-08-15 DIAGNOSIS — R945 Abnormal results of liver function studies: Secondary | ICD-10-CM | POA: Insufficient documentation

## 2018-08-15 DIAGNOSIS — R7989 Other specified abnormal findings of blood chemistry: Secondary | ICD-10-CM | POA: Insufficient documentation

## 2018-08-15 DIAGNOSIS — L989 Disorder of the skin and subcutaneous tissue, unspecified: Secondary | ICD-10-CM

## 2018-08-16 NOTE — Progress Notes (Signed)
   Subjective: 68 year old male presenting today as a new patient with a chief complaint of a painful callus lesion noted to the right sub-fifth MPJ that has been present for the past few years. He states the pain is progressively worsening and it feels as if he is walking on a rock. Walking on hard floors increases the pain. He has not had any treatment for his symptoms. Patient is here for further evaluation and treatment.   No past medical history on file.   Objective:  Physical Exam General: Alert and oriented x3 in no acute distress  Dermatology: Hyperkeratotic lesion present on the right sub-fifth MPJ. Pain on palpation with a central nucleated core noted. Skin is warm, dry and supple bilateral lower extremities. Negative for open lesions or macerations.  Vascular: Palpable pedal pulses bilaterally. No edema or erythema noted. Capillary refill within normal limits.  Neurological: Epicritic and protective threshold grossly intact bilaterally.   Musculoskeletal Exam: Pain on palpation at the keratotic lesion noted as well as to the 5th MPJ of the right foot. Range of motion within normal limits bilateral. Muscle strength 5/5 in all groups bilateral.  Radiographic Exam:  Normal osseous mineralization. Joint spaces preserved. No fracture/dislocation/boney destruction.    Assessment: 1. Porokeratosis right sub-fifth MPJ  2. 5th MPJ right capsulitis    Plan of Care:  1. Patient evaluated. X-Rays reviewed.  2. Excisional debridement of keratoic lesion using a chisel blade was performed without incident. Salinocaine applied.  3. Dressed area with light dressing. 4. Injection of 0.5 mLs Celestone Soluspan injected into the 5th MPJ of the right foot.  5. Continue wearing New Balance shoes.  6. Return to clinic in 3 months.    Edrick Kins, DPM [facility]  Dr. Edrick Kins, [place]                                        [place], [place] [address]                 Office [phone]  Fax [phone]

## 2018-10-05 ENCOUNTER — Other Ambulatory Visit: Payer: Self-pay

## 2018-10-05 ENCOUNTER — Encounter: Payer: Self-pay | Admitting: Podiatry

## 2018-10-05 ENCOUNTER — Ambulatory Visit: Payer: Medicare HMO | Admitting: Podiatry

## 2018-10-05 VITALS — Temp 96.0°F

## 2018-10-05 DIAGNOSIS — E119 Type 2 diabetes mellitus without complications: Secondary | ICD-10-CM | POA: Diagnosis not present

## 2018-10-05 DIAGNOSIS — Q828 Other specified congenital malformations of skin: Secondary | ICD-10-CM

## 2018-10-05 DIAGNOSIS — M7751 Other enthesopathy of right foot: Secondary | ICD-10-CM

## 2018-10-05 DIAGNOSIS — L989 Disorder of the skin and subcutaneous tissue, unspecified: Secondary | ICD-10-CM | POA: Diagnosis not present

## 2018-10-05 NOTE — Progress Notes (Signed)
This patient presents the office with chief complaint of continued pain developing on the outside ball of his right foot.  He was seen approximately 5 weeks ago by Dr. Amalia Hailey who treated his callus and injected the painful site.  He was then told to make an appointment with me for follow-up care for the painful callus right foot.  He presents the office today stating that the pain is starting to get as painful as it was initially.  This patient does walk with a waddling gait.  Patient presents the office today for an evaluation and treatment.  Patient is presently taking Metformin p.o.Marland Kitchen  Vascular  Dorsalis pedis and posterior tibial pulses are palpable  B/L.  Capillary return  WNL.  Temperature gradient is  WNL.  Skin turgor  WNL  Sensorium  Senn Weinstein monofilament wire  WNL. Normal tactile sensation.  Nail Exam  Patient has normal nails with no evidence of bacterial or fungal infection.  Orthopedic  Exam  Muscle tone and muscle strength  WNL.  No limitations of motion feet  B/L.  No crepitus or joint effusion noted.  Foot type is unremarkable and digits show no abnormalities.  Plantar flexed fifth metatarsal right foot.  Skin  No open lesions.  Normal skin texture and turgor. 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  The porokeratosis is blackened which might have been caused by foreign body  Porokeratosis right foot  Debride porokeratosis right foot.  Discussed possible surgery since it has been 6 weeks since his initial exam.  RTC 3 months.   Gardiner Barefoot DPM

## 2018-11-13 ENCOUNTER — Ambulatory Visit: Payer: Medicare HMO | Admitting: Podiatry

## 2018-12-05 DIAGNOSIS — H60391 Other infective otitis externa, right ear: Secondary | ICD-10-CM | POA: Diagnosis not present

## 2018-12-05 DIAGNOSIS — H6060 Unspecified chronic otitis externa, unspecified ear: Secondary | ICD-10-CM | POA: Diagnosis not present

## 2018-12-05 DIAGNOSIS — H6121 Impacted cerumen, right ear: Secondary | ICD-10-CM | POA: Diagnosis not present

## 2018-12-05 DIAGNOSIS — H903 Sensorineural hearing loss, bilateral: Secondary | ICD-10-CM | POA: Diagnosis not present

## 2018-12-25 DIAGNOSIS — M5413 Radiculopathy, cervicothoracic region: Secondary | ICD-10-CM | POA: Diagnosis not present

## 2018-12-25 DIAGNOSIS — M9901 Segmental and somatic dysfunction of cervical region: Secondary | ICD-10-CM | POA: Diagnosis not present

## 2018-12-25 DIAGNOSIS — M542 Cervicalgia: Secondary | ICD-10-CM | POA: Diagnosis not present

## 2018-12-25 DIAGNOSIS — M62838 Other muscle spasm: Secondary | ICD-10-CM | POA: Diagnosis not present

## 2019-01-01 DIAGNOSIS — R42 Dizziness and giddiness: Secondary | ICD-10-CM | POA: Diagnosis not present

## 2019-01-01 DIAGNOSIS — I10 Essential (primary) hypertension: Secondary | ICD-10-CM | POA: Diagnosis not present

## 2019-01-04 ENCOUNTER — Ambulatory Visit: Payer: Medicare HMO | Admitting: Podiatry

## 2019-02-28 DIAGNOSIS — H6983 Other specified disorders of Eustachian tube, bilateral: Secondary | ICD-10-CM | POA: Diagnosis not present

## 2019-04-03 DIAGNOSIS — Z0000 Encounter for general adult medical examination without abnormal findings: Secondary | ICD-10-CM | POA: Diagnosis not present

## 2019-04-03 DIAGNOSIS — E785 Hyperlipidemia, unspecified: Secondary | ICD-10-CM | POA: Diagnosis not present

## 2019-04-03 DIAGNOSIS — E119 Type 2 diabetes mellitus without complications: Secondary | ICD-10-CM | POA: Diagnosis not present

## 2019-04-03 DIAGNOSIS — Z79899 Other long term (current) drug therapy: Secondary | ICD-10-CM | POA: Diagnosis not present

## 2019-04-03 DIAGNOSIS — G40909 Epilepsy, unspecified, not intractable, without status epilepticus: Secondary | ICD-10-CM | POA: Diagnosis not present

## 2019-04-03 DIAGNOSIS — R569 Unspecified convulsions: Secondary | ICD-10-CM | POA: Diagnosis not present

## 2019-04-03 DIAGNOSIS — I10 Essential (primary) hypertension: Secondary | ICD-10-CM | POA: Diagnosis not present

## 2019-04-04 DIAGNOSIS — Z1211 Encounter for screening for malignant neoplasm of colon: Secondary | ICD-10-CM | POA: Diagnosis not present

## 2019-06-26 DIAGNOSIS — R569 Unspecified convulsions: Secondary | ICD-10-CM | POA: Diagnosis not present

## 2019-06-26 DIAGNOSIS — E119 Type 2 diabetes mellitus without complications: Secondary | ICD-10-CM | POA: Diagnosis not present

## 2019-06-26 DIAGNOSIS — Z79899 Other long term (current) drug therapy: Secondary | ICD-10-CM | POA: Diagnosis not present

## 2019-06-26 DIAGNOSIS — E785 Hyperlipidemia, unspecified: Secondary | ICD-10-CM | POA: Diagnosis not present

## 2019-06-26 DIAGNOSIS — G40909 Epilepsy, unspecified, not intractable, without status epilepticus: Secondary | ICD-10-CM | POA: Diagnosis not present

## 2019-06-26 DIAGNOSIS — Z0000 Encounter for general adult medical examination without abnormal findings: Secondary | ICD-10-CM | POA: Diagnosis not present

## 2019-06-26 DIAGNOSIS — I10 Essential (primary) hypertension: Secondary | ICD-10-CM | POA: Diagnosis not present

## 2019-10-04 DIAGNOSIS — H6121 Impacted cerumen, right ear: Secondary | ICD-10-CM | POA: Diagnosis not present

## 2019-10-04 DIAGNOSIS — H60391 Other infective otitis externa, right ear: Secondary | ICD-10-CM | POA: Diagnosis not present

## 2019-10-16 DIAGNOSIS — E7800 Pure hypercholesterolemia, unspecified: Secondary | ICD-10-CM | POA: Diagnosis not present

## 2019-10-16 DIAGNOSIS — G40909 Epilepsy, unspecified, not intractable, without status epilepticus: Secondary | ICD-10-CM | POA: Diagnosis not present

## 2019-10-16 DIAGNOSIS — I10 Essential (primary) hypertension: Secondary | ICD-10-CM | POA: Diagnosis not present

## 2019-10-16 DIAGNOSIS — Z125 Encounter for screening for malignant neoplasm of prostate: Secondary | ICD-10-CM | POA: Diagnosis not present

## 2019-10-16 DIAGNOSIS — R739 Hyperglycemia, unspecified: Secondary | ICD-10-CM | POA: Diagnosis not present

## 2019-10-16 DIAGNOSIS — Z79899 Other long term (current) drug therapy: Secondary | ICD-10-CM | POA: Diagnosis not present

## 2019-10-29 DIAGNOSIS — K603 Anal fistula: Secondary | ICD-10-CM | POA: Diagnosis not present

## 2019-10-29 DIAGNOSIS — K625 Hemorrhage of anus and rectum: Secondary | ICD-10-CM | POA: Diagnosis not present

## 2019-11-06 DIAGNOSIS — K603 Anal fistula: Secondary | ICD-10-CM | POA: Diagnosis not present

## 2019-11-13 DIAGNOSIS — K603 Anal fistula: Secondary | ICD-10-CM | POA: Diagnosis not present

## 2019-11-14 ENCOUNTER — Ambulatory Visit: Payer: Self-pay | Admitting: General Surgery

## 2019-11-14 NOTE — H&P (Signed)
The patient is a 69 year old male who presents with anal fistula. 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 This has been present since 2015 when he developed a retrorectal abscess, which required a drainage catheter. He then developed a fistula which was treated at [facility] with a fistula plug. The plug fell out and he was taken back to the operating room in May 2016 for seton placement. It appears that he then developed an abscess in the left lateral perirectal space, which was drained in the operating room in June 2018. A Penrose catheter was placed and then taken out, several weeks later. Since that time he has had an indwelling seton with continued purulent drainage.   Past Surgical History (Tanisha A. Owens Shark, [place]; 11/13/2019 9:59 AM) Anal Fissure Repair   Allergies (Tanisha A. Owens Shark, [place]; 11/13/2019 9:59 AM) Penicillins  Allergies Reconciled   Medication History (Tanisha A. Owens Shark, [place]; 11/13/2019 10:00 AM) PHENobarbital (32.4MG  Tablet, Oral) Active. Diphenoxylate-Atropine (2.5-0.025MG  Tablet, Oral) Active. Atorvastatin Calcium (80MG  Tablet, Oral) Active. Glimepiride (4MG  Tablet, Oral) Active. Loperamide HCl (2MG  Capsule, Oral) Active. Losartan Potassium-HCTZ (100-25MG  Tablet, Oral) Active. Meclizine HCl (25MG  Tablet, Oral) Active. Phenytoin Sodium Extended (100MG  Capsule, Oral) Active. Medications Reconciled  Social History (Tanisha A. Owens Shark, [place]; 11/13/2019 9:59 AM) No alcohol use  No caffeine use  No drug use  Tobacco use  Never smoker.  Family History (Tanisha A. Owens Shark, [place]; 11/13/2019 9:59 AM) Cerebrovascular Accident  Father.    Review of Systems (Tanisha A. Brown RMA; 11/13/2019 9:59 AM) General Not Present- Appetite Loss, Chills, Fatigue, Fever, Night Sweats, Weight Gain and Weight Loss. Skin Present- Non-Healing Wounds. Not Present- Change in Wart/Mole, Dryness, Hives, Jaundice, New Lesions, Rash and Ulcer. HEENT Not  Present- Earache, Hearing Loss, Hoarseness, Nose Bleed, Oral Ulcers, Ringing in the Ears, Seasonal Allergies, Sinus Pain, Sore Throat, Visual Disturbances, Wears glasses/contact lenses and Yellow Eyes. Respiratory Not Present- Bloody sputum, Chronic Cough, Difficulty Breathing, Snoring and Wheezing. Breast Not Present- Breast Mass, Breast Pain, Nipple Discharge and Skin Changes. Cardiovascular Not Present- Chest Pain, Difficulty Breathing Lying Down, Leg Cramps, Palpitations, Rapid Heart Rate, Shortness of Breath and Swelling of Extremities. Gastrointestinal Not Present- Abdominal Pain, Bloating, Bloody Stool, Change in Bowel Habits, Chronic diarrhea, Constipation, Difficulty Swallowing, Excessive gas, Gets full quickly at meals, Hemorrhoids, Indigestion, Nausea, Rectal Pain and Vomiting. Male Genitourinary Not Present- Blood in Urine, Change in Urinary Stream, Frequency, Impotence, Nocturia, Painful Urination, Urgency and Urine Leakage.  Vitals (Tanisha A. Brown RMA; 11/13/2019 10:00 AM) 11/13/2019 10:00 AM Weight: 211 lb Height: 66in Body Surface Area: 2.05 m Body Mass Index: 34.06 kg/m  Temp.: 26F  Pulse: 76 (Regular)  BP: 132/84(Sitting, Left Arm, Standard)       Physical Exam Leighton Ruff MD; 0000000 10:28 AM) General Mental Status-Alert. General Appearance-Cooperative. CV: RRR Lungs: CTA Rectal Anorectal Exam External - Note: Large amount of granulation tissue around the external opening of a seton with purulent drainage noted.    Assessment & Plan Leighton Ruff MD; 0000000 10:31 AM) ANAL FISTULA (K60.3) Impression: 69 year old male who presents to the office for evaluation of an indwelling anal seton that has been present since June 2018. He reports persistent purulent drainage. On exam, he has an intact seton with a left posterior lateral anal fistula. We discussed ligation of internal fistula tract as a possible treatment. We discussed that this has an  approximately 80% chance of success. An approximately 20% need further procedures. We also discussed  performing a fistulotomy. I think after reviewing the notes from his previous operations that this will require resection of quite a bit of his sphincter complex and he would be at risk for incontinence. This would have a 98% chance of resolving his fistula, though. These options were presented to the patient and he would like to think about it. He will call the office if he decides to proceed with either surgery.

## 2019-11-14 NOTE — H&P (Addendum)
The patient is a 69 year old male who presents with anal fistula. 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 This has been present since 2015 when he developed a retrorectal abscess, which required a drainage catheter. He then developed a fistula which was treated at [facility] with a fistula plug. The plug fell out and he was taken back to the operating room in May 2016 for seton placement. It appears that he then developed an abscess in the left lateral perirectal space, which was drained in the operating room in June 2018. A Penrose catheter was placed and then taken out, several weeks later. Since that time he has had an indwelling seton with continued purulent drainage.   Past Surgical History (Tanisha A. Owens Shark, [place]; 11/13/2019 9:59 AM) Anal Fissure Repair   Allergies (Tanisha A. Owens Shark, [place]; 11/13/2019 9:59 AM) Penicillins  Allergies Reconciled   Medication History (Tanisha A. Brown, [place]; 11/13/2019 10:00 AM) PHENobarbital (32.4MG  Tablet, Oral) Active. Diphenoxylate-Atropine (2.5-0.025MG  Tablet, Oral) Active. Atorvastatin Calcium (80MG  Tablet, Oral) Active. Glimepiride (4MG  Tablet, Oral) Active. Loperamide HCl (2MG  Capsule, Oral) Active. Losartan Potassium-HCTZ (100-25MG  Tablet, Oral) Active. Meclizine HCl (25MG  Tablet, Oral) Active. Phenytoin Sodium Extended (100MG  Capsule, Oral) Active. Medications Reconciled  Social History (Tanisha A. Owens Shark, [place]; 11/13/2019 9:59 AM) No alcohol use  No caffeine use  No drug use  Tobacco use  Never smoker.  Family History (Tanisha A. Owens Shark, [place]; 11/13/2019 9:59 AM) Cerebrovascular Accident  Father.    Review of Systems (Tanisha A. Brown RMA; 11/13/2019 9:59 AM) General Not Present- Appetite Loss, Chills, Fatigue, Fever, Night Sweats, Weight Gain and Weight Loss. Skin Present- Non-Healing Wounds. Not Present- Change in Wart/Mole, Dryness, Hives, Jaundice, New Lesions, Rash and Ulcer. HEENT Not  Present- Earache, Hearing Loss, Hoarseness, Nose Bleed, Oral Ulcers, Ringing in the Ears, Seasonal Allergies, Sinus Pain, Sore Throat, Visual Disturbances, Wears glasses/contact lenses and Yellow Eyes. Respiratory Not Present- Bloody sputum, Chronic Cough, Difficulty Breathing, Snoring and Wheezing. Breast Not Present- Breast Mass, Breast Pain, Nipple Discharge and Skin Changes. Cardiovascular Not Present- Chest Pain, Difficulty Breathing Lying Down, Leg Cramps, Palpitations, Rapid Heart Rate, Shortness of Breath and Swelling of Extremities. Gastrointestinal Not Present- Abdominal Pain, Bloating, Bloody Stool, Change in Bowel Habits, Chronic diarrhea, Constipation, Difficulty Swallowing, Excessive gas, Gets full quickly at meals, Hemorrhoids, Indigestion, Nausea, Rectal Pain and Vomiting. Male Genitourinary Not Present- Blood in Urine, Change in Urinary Stream, Frequency, Impotence, Nocturia, Painful Urination, Urgency and Urine Leakage.  Vitals (Tanisha A. Brown RMA; 11/13/2019 10:00 AM) 11/13/2019 10:00 AM Weight: 211 lb Height: 66in Body Surface Area: 2.05 m Body Mass Index: 34.06 kg/m  Temp.: 65F  Pulse: 76 (Regular)  BP: 132/84(Sitting, Left Arm, Standard)       Physical Exam Leighton Ruff MD; 0000000 10:28 AM) General Mental Status-Alert. General Appearance-Cooperative.  Rectal Anorectal Exam External - Note: Large amount of granulation tissue around the external opening of a seton with purulent drainage noted.    Assessment & Plan Leighton Ruff MD; 0000000 10:31 AM) ANAL FISTULA (K60.3) Impression: 69 year old male who presents to the office for evaluation of an indwelling anal seton that has been present since June 2018. He reports persistent purulent drainage. On exam, he has an intact seton with a left posterior lateral anal fistula. We discussed ligation of internal fistula tract as a possible treatment. We discussed that this has an approximately 80%  chance of success. An approximately 20% need further procedures. We also discussed performing a fistulotomy. I  think after reviewing the notes from his previous operations that this will require resection of quite a bit of his sphincter complex and he would be at risk for incontinence. This would have a 98% chance of resolving his fistula, though. These options were presented to the patient and he has decided to proceed with a LIFT procedure.

## 2019-11-16 DIAGNOSIS — D72828 Other elevated white blood cell count: Secondary | ICD-10-CM | POA: Diagnosis not present

## 2019-11-16 DIAGNOSIS — R710 Precipitous drop in hematocrit: Secondary | ICD-10-CM | POA: Diagnosis not present

## 2019-12-14 ENCOUNTER — Encounter (HOSPITAL_BASED_OUTPATIENT_CLINIC_OR_DEPARTMENT_OTHER): Payer: Self-pay | Admitting: General Surgery

## 2019-12-14 ENCOUNTER — Other Ambulatory Visit: Payer: Self-pay

## 2019-12-14 NOTE — Progress Notes (Signed)
NEW Covid Policy July 1062  Surgery Day:  12-20-19  Facility:  [facility]  Type of Surgery: ligation of internal fistula  Fully Covid Vaccinated:   no Not Covid Vaccinated:  Yes covid test scheduled 12-18-19 at 1030  Do you have symptoms?no  In the past 14 days:no        Have you had any symptoms?no       Have you been tested covid positive?no       Have you been in contact with someone covid positive?no        Is pt Immuno-compromised?no

## 2019-12-14 NOTE — Progress Notes (Addendum)
Spoke w/ via phone for pre-op interview---pt and wife Olegario Shearer Lab needs dos----I stat 8, ekg              COVID test ------12-18-2019 at 1030 Arrive at -------700 am 12-20-19 No food after midnight clear liquids from midnight until 600 am then npo Medications to take morning of surgery -----phenytoin, phenobarbitol, atorvastatin Diabetic medication -----none day of surgery Patient Special Instructions -----none Pre-Op special Istructions -----none Patient verbalized understanding of instructions that were given at this phone interview. Patient denies shortness of breath, chest pain, fever, cough a this phone interview.  Seizures managed by pcp dr Dellis Filbert sparks lov 10-16-2019

## 2019-12-18 ENCOUNTER — Other Ambulatory Visit (HOSPITAL_COMMUNITY)
Admission: RE | Admit: 2019-12-18 | Discharge: 2019-12-18 | Disposition: A | Discharge status: Home / Self Care | Payer: Medicare HMO | Source: Ambulatory Visit | Attending: General Surgery | Admitting: General Surgery

## 2019-12-18 DIAGNOSIS — Z01812 Encounter for preprocedural laboratory examination: Secondary | ICD-10-CM | POA: Diagnosis not present

## 2019-12-18 DIAGNOSIS — Z20822 Contact with and (suspected) exposure to covid-19: Secondary | ICD-10-CM | POA: Insufficient documentation

## 2019-12-18 LAB — SARS CORONAVIRUS 2 (TAT 6-24 HRS): SARS Coronavirus 2: NEGATIVE

## 2019-12-20 ENCOUNTER — Ambulatory Visit (HOSPITAL_BASED_OUTPATIENT_CLINIC_OR_DEPARTMENT_OTHER)
Admission: RE | Admit: 2019-12-20 | Discharge: 2019-12-20 | Disposition: A | Discharge status: Home / Self Care | Payer: Medicare HMO | Attending: General Surgery | Admitting: General Surgery

## 2019-12-20 ENCOUNTER — Ambulatory Visit (HOSPITAL_BASED_OUTPATIENT_CLINIC_OR_DEPARTMENT_OTHER): Payer: Medicare HMO | Admitting: Anesthesiology

## 2019-12-20 ENCOUNTER — Other Ambulatory Visit: Payer: Self-pay

## 2019-12-20 ENCOUNTER — Encounter (HOSPITAL_BASED_OUTPATIENT_CLINIC_OR_DEPARTMENT_OTHER)
Admission: RE | Disposition: A | Discharge status: Home / Self Care | Payer: Self-pay | Source: Home / Self Care | Attending: General Surgery

## 2019-12-20 ENCOUNTER — Encounter (HOSPITAL_BASED_OUTPATIENT_CLINIC_OR_DEPARTMENT_OTHER): Payer: Self-pay | Admitting: General Surgery

## 2019-12-20 DIAGNOSIS — I10 Essential (primary) hypertension: Secondary | ICD-10-CM | POA: Diagnosis not present

## 2019-12-20 DIAGNOSIS — K603 Anal fistula: Secondary | ICD-10-CM | POA: Insufficient documentation

## 2019-12-20 DIAGNOSIS — Z880 Allergy status to penicillin: Secondary | ICD-10-CM | POA: Diagnosis not present

## 2019-12-20 DIAGNOSIS — N471 Phimosis: Secondary | ICD-10-CM | POA: Diagnosis not present

## 2019-12-20 DIAGNOSIS — Z79899 Other long term (current) drug therapy: Secondary | ICD-10-CM | POA: Insufficient documentation

## 2019-12-20 DIAGNOSIS — Z466 Encounter for fitting and adjustment of urinary device: Secondary | ICD-10-CM | POA: Diagnosis not present

## 2019-12-20 DIAGNOSIS — E119 Type 2 diabetes mellitus without complications: Secondary | ICD-10-CM | POA: Diagnosis not present

## 2019-12-20 DIAGNOSIS — Z7984 Long term (current) use of oral hypoglycemic drugs: Secondary | ICD-10-CM | POA: Diagnosis not present

## 2019-12-20 DIAGNOSIS — R569 Unspecified convulsions: Secondary | ICD-10-CM | POA: Diagnosis not present

## 2019-12-20 DIAGNOSIS — R338 Other retention of urine: Secondary | ICD-10-CM | POA: Diagnosis not present

## 2019-12-20 DIAGNOSIS — E785 Hyperlipidemia, unspecified: Secondary | ICD-10-CM | POA: Diagnosis not present

## 2019-12-20 HISTORY — DX: Unspecified osteoarthritis, unspecified site: M19.90

## 2019-12-20 HISTORY — DX: Essential (primary) hypertension: I10

## 2019-12-20 HISTORY — DX: Epilepsy, unspecified, not intractable, without status epilepticus: G40.909

## 2019-12-20 HISTORY — DX: Hyperlipidemia, unspecified: E78.5

## 2019-12-20 HISTORY — DX: Type 2 diabetes mellitus without complications: E11.9

## 2019-12-20 HISTORY — DX: Anal fissure, unspecified: K60.2

## 2019-12-20 HISTORY — DX: Unspecified convulsions: R56.9

## 2019-12-20 HISTORY — PX: LIGATION OF INTERNAL FISTULA TRACT: SHX6551

## 2019-12-20 HISTORY — DX: Malignant (primary) neoplasm, unspecified: C80.1

## 2019-12-20 LAB — POCT I-STAT, CHEM 8
BUN: 29 mg/dL — ABNORMAL HIGH (ref 8–23)
Calcium, Ion: 1.2 mmol/L (ref 1.15–1.40)
Chloride: 97 mmol/L — ABNORMAL LOW (ref 98–111)
Creatinine, Ser: 0.9 mg/dL (ref 0.61–1.24)
Glucose, Bld: 112 mg/dL — ABNORMAL HIGH (ref 70–99)
HCT: 33 % — ABNORMAL LOW (ref 39.0–52.0)
Hemoglobin: 11.2 g/dL — ABNORMAL LOW (ref 13.0–17.0)
Potassium: 3.3 mmol/L — ABNORMAL LOW (ref 3.5–5.1)
Sodium: 138 mmol/L (ref 135–145)
TCO2: 29 mmol/L (ref 22–32)

## 2019-12-20 LAB — GLUCOSE, CAPILLARY
Glucose-Capillary: 136 mg/dL — ABNORMAL HIGH (ref 70–99)
Glucose-Capillary: 323 mg/dL — ABNORMAL HIGH (ref 70–99)

## 2019-12-20 SURGERY — LIGATION, INTERNAL FISTULA TRACT
Anesthesia: General | Site: Rectum

## 2019-12-20 MED ORDER — FENTANYL CITRATE (PF) 100 MCG/2ML IJ SOLN: 25.0000 ug | INTRAMUSCULAR | Status: DC | PRN

## 2019-12-20 MED ORDER — SODIUM CHLORIDE 0.9% FLUSH: 3.0000 mL | Freq: Two times a day (BID) | INTRAVENOUS | Status: DC

## 2019-12-20 MED ORDER — ACETAMINOPHEN 325 MG RE SUPP: 650.0000 mg | RECTAL | Status: DC | PRN

## 2019-12-20 MED ORDER — ACETAMINOPHEN 325 MG PO TABS: 650.0000 mg | ORAL_TABLET | ORAL | Status: DC | PRN

## 2019-12-20 MED ORDER — OXYCODONE HCL 5 MG/5ML PO SOLN: 5.0000 mg | Freq: Once | ORAL | Status: DC | PRN

## 2019-12-20 MED ORDER — LIDOCAINE 5 % EX OINT
TOPICAL_OINTMENT | CUTANEOUS | Status: DC | PRN
  Administered 2019-12-20: 1

## 2019-12-20 MED ORDER — BUPIVACAINE LIPOSOME 1.3 % IJ SUSP: 20.0000 mL | Freq: Once | INTRAMUSCULAR | Status: DC

## 2019-12-20 MED ORDER — EPHEDRINE 5 MG/ML INJ
INTRAVENOUS | Status: AC
  Filled 2019-12-20: qty 10

## 2019-12-20 MED ORDER — PROPOFOL 10 MG/ML IV BOLUS
INTRAVENOUS | Status: DC | PRN
  Administered 2019-12-20: 140 mg via INTRAVENOUS

## 2019-12-20 MED ORDER — FENTANYL CITRATE (PF) 100 MCG/2ML IJ SOLN
INTRAMUSCULAR | Status: AC
  Filled 2019-12-20: qty 2

## 2019-12-20 MED ORDER — ACETAMINOPHEN 500 MG PO TABS
1000.0000 mg | ORAL_TABLET | ORAL | Status: AC
  Administered 2019-12-20: 1000 mg via ORAL

## 2019-12-20 MED ORDER — ONDANSETRON HCL 4 MG/2ML IJ SOLN
INTRAMUSCULAR | Status: AC
  Filled 2019-12-20: qty 2

## 2019-12-20 MED ORDER — SUCCINYLCHOLINE CHLORIDE 200 MG/10ML IV SOSY
PREFILLED_SYRINGE | INTRAVENOUS | Status: DC | PRN
  Administered 2019-12-20: 100 mg via INTRAVENOUS

## 2019-12-20 MED ORDER — SODIUM CHLORIDE 0.9% FLUSH: 3.0000 mL | INTRAVENOUS | Status: DC | PRN

## 2019-12-20 MED ORDER — OXYCODONE HCL 5 MG PO TABS: 5.0000 mg | ORAL_TABLET | Freq: Once | ORAL | Status: DC | PRN

## 2019-12-20 MED ORDER — DEXAMETHASONE SODIUM PHOSPHATE 10 MG/ML IJ SOLN
INTRAMUSCULAR | Status: AC
  Filled 2019-12-20: qty 1

## 2019-12-20 MED ORDER — FENTANYL CITRATE (PF) 100 MCG/2ML IJ SOLN
INTRAMUSCULAR | Status: DC | PRN
  Administered 2019-12-20: 50 ug via INTRAVENOUS
  Administered 2019-12-20 (×2): 25 ug via INTRAVENOUS

## 2019-12-20 MED ORDER — GABAPENTIN 300 MG PO CAPS
300.0000 mg | ORAL_CAPSULE | ORAL | Status: AC
  Administered 2019-12-20: 300 mg via ORAL

## 2019-12-20 MED ORDER — DIAZEPAM 5 MG PO TABS
ORAL_TABLET | ORAL | Status: AC
  Filled 2019-12-20: qty 1

## 2019-12-20 MED ORDER — OXYCODONE HCL 5 MG PO TABS: 5.0000 mg | ORAL_TABLET | ORAL | Status: DC | PRN

## 2019-12-20 MED ORDER — DIAZEPAM 5 MG PO TABS
5.0000 mg | ORAL_TABLET | Freq: Once | ORAL | Status: AC
  Administered 2019-12-20: 5 mg via ORAL

## 2019-12-20 MED ORDER — LIDOCAINE 2% (20 MG/ML) 5 ML SYRINGE
INTRAMUSCULAR | Status: DC | PRN
  Administered 2019-12-20: 100 mg via INTRAVENOUS

## 2019-12-20 MED ORDER — DIAZEPAM 5 MG PO TABS
5.0000 mg | ORAL_TABLET | Freq: Four times a day (QID) | ORAL | 0 refills | Status: DC | PRN
Start: 2019-12-20 — End: 2020-03-27

## 2019-12-20 MED ORDER — BUPIVACAINE LIPOSOME 1.3 % IJ SUSP
INTRAMUSCULAR | Status: DC | PRN
  Administered 2019-12-20: 20 mL

## 2019-12-20 MED ORDER — GABAPENTIN 300 MG PO CAPS
ORAL_CAPSULE | ORAL | Status: AC
  Filled 2019-12-20: qty 1

## 2019-12-20 MED ORDER — BUPIVACAINE-EPINEPHRINE 0.5% -1:200000 IJ SOLN
INTRAMUSCULAR | Status: DC | PRN
  Administered 2019-12-20: 30 mL

## 2019-12-20 MED ORDER — ROCURONIUM BROMIDE 10 MG/ML (PF) SYRINGE
PREFILLED_SYRINGE | INTRAVENOUS | Status: DC | PRN
  Administered 2019-12-20: 10 mg via INTRAVENOUS
  Administered 2019-12-20: 20 mg via INTRAVENOUS

## 2019-12-20 MED ORDER — ACETAMINOPHEN 500 MG PO TABS
ORAL_TABLET | ORAL | Status: AC
  Filled 2019-12-20: qty 2

## 2019-12-20 MED ORDER — PROPOFOL 10 MG/ML IV BOLUS
INTRAVENOUS | Status: AC
  Filled 2019-12-20: qty 20

## 2019-12-20 MED ORDER — OXYCODONE HCL 5 MG PO TABS: 5.0000 mg | ORAL_TABLET | ORAL | 0 refills | Status: DC | PRN

## 2019-12-20 MED ORDER — SODIUM CHLORIDE 0.9 % IV SOLN: 250.0000 mL | INTRAVENOUS | Status: DC | PRN

## 2019-12-20 MED ORDER — LACTATED RINGERS IV SOLN
INTRAVENOUS | Status: DC
  Administered 2019-12-20: 50 mL via INTRAVENOUS

## 2019-12-20 MED ORDER — SUGAMMADEX SODIUM 200 MG/2ML IV SOLN
INTRAVENOUS | Status: DC | PRN
  Administered 2019-12-20: 180 mg via INTRAVENOUS

## 2019-12-20 MED ORDER — DEXAMETHASONE SODIUM PHOSPHATE 10 MG/ML IJ SOLN
INTRAMUSCULAR | Status: DC | PRN
  Administered 2019-12-20: 5 mg via INTRAVENOUS

## 2019-12-20 MED ORDER — LIDOCAINE 2% (20 MG/ML) 5 ML SYRINGE
INTRAMUSCULAR | Status: AC
  Filled 2019-12-20: qty 5

## 2019-12-20 MED ORDER — SUCCINYLCHOLINE CHLORIDE 200 MG/10ML IV SOSY
PREFILLED_SYRINGE | INTRAVENOUS | Status: AC
  Filled 2019-12-20: qty 10

## 2019-12-20 MED ORDER — ROCURONIUM BROMIDE 10 MG/ML (PF) SYRINGE
PREFILLED_SYRINGE | INTRAVENOUS | Status: AC
  Filled 2019-12-20: qty 10

## 2019-12-20 MED ORDER — ONDANSETRON HCL 4 MG/2ML IJ SOLN
INTRAMUSCULAR | Status: DC | PRN
  Administered 2019-12-20: 4 mg via INTRAVENOUS

## 2019-12-20 SURGICAL SUPPLY — 59 items
BENZOIN TINCTURE PRP APPL 2/3 (GAUZE/BANDAGES/DRESSINGS) ×3 IMPLANT
BLADE EXTENDED COATED 6.5IN (ELECTRODE) IMPLANT
BLADE HEX COATED 2.75 (ELECTRODE) ×3 IMPLANT
BLADE SURG 10 STRL SS (BLADE) IMPLANT
BRIEF STRETCH FOR OB PAD LRG (UNDERPADS AND DIAPERS) ×3 IMPLANT
CANISTER SUCT 3000ML PPV (MISCELLANEOUS) ×3 IMPLANT
COVER BACK TABLE 60X90IN (DRAPES) ×3 IMPLANT
COVER MAYO STAND STRL (DRAPES) ×3 IMPLANT
COVER WAND RF STERILE (DRAPES) ×3 IMPLANT
DRAPE LAPAROTOMY 100X72 PEDS (DRAPES) ×3 IMPLANT
DRAPE UTILITY XL STRL (DRAPES) ×3 IMPLANT
DRSG PAD ABDOMINAL 8X10 ST (GAUZE/BANDAGES/DRESSINGS) ×3 IMPLANT
ELECT REM PT RETURN 9FT ADLT (ELECTROSURGICAL) ×3
ELECTRODE REM PT RTRN 9FT ADLT (ELECTROSURGICAL) ×1 IMPLANT
GAUZE SPONGE 4X4 12PLY STRL (GAUZE/BANDAGES/DRESSINGS) ×3 IMPLANT
GAUZE SPONGE 4X4 12PLY STRL LF (GAUZE/BANDAGES/DRESSINGS) ×3 IMPLANT
GLOVE BIO SURGEON STRL SZ 6.5 (GLOVE) ×4 IMPLANT
GLOVE BIO SURGEONS STRL SZ 6.5 (GLOVE) ×2
GLOVE BIOGEL PI IND STRL 7.0 (GLOVE) ×1 IMPLANT
GLOVE BIOGEL PI INDICATOR 7.0 (GLOVE) ×2
GOWN STRL REUS W/TWL XL LVL3 (GOWN DISPOSABLE) ×3 IMPLANT
HYDROGEN PEROXIDE 16OZ (MISCELLANEOUS) ×3 IMPLANT
IV CATH 14GX2 1/4 (CATHETERS) ×3 IMPLANT
IV CATH 18G SAFETY (IV SOLUTION) IMPLANT
KIT SIGMOIDOSCOPE (SET/KITS/TRAYS/PACK) IMPLANT
KIT TURNOVER CYSTO (KITS) ×3 IMPLANT
NEEDLE HYPO 22GX1.5 SAFETY (NEEDLE) ×3 IMPLANT
NS IRRIG 500ML POUR BTL (IV SOLUTION) ×3 IMPLANT
PAD ARMBOARD 7.5X6 YLW CONV (MISCELLANEOUS) IMPLANT
PENCIL BUTTON HOLSTER BLD 10FT (ELECTRODE) ×3 IMPLANT
RETRACTOR STAY HOOK 5MM (MISCELLANEOUS) ×3 IMPLANT
RETRACTOR STERILE 25.8CMX11.3 (INSTRUMENTS) ×3 IMPLANT
SET BASIN DAY SURGERY F.S. (CUSTOM PROCEDURE TRAY) ×3 IMPLANT
SPONGE SURGIFOAM ABS GEL 12-7 (HEMOSTASIS) ×3 IMPLANT
SUCTION FRAZIER HANDLE 10FR (MISCELLANEOUS)
SUCTION TUBE FRAZIER 10FR DISP (MISCELLANEOUS) IMPLANT
SUT CHROMIC 2 0 SH (SUTURE) ×3 IMPLANT
SUT CHROMIC 3 0 SH 27 (SUTURE) IMPLANT
SUT SILK 2 0 (SUTURE) ×2
SUT SILK 2 0 SH CR/8 (SUTURE) ×3 IMPLANT
SUT SILK 2-0 18XBRD TIE 12 (SUTURE) ×1 IMPLANT
SUT SILK 3 0 SH 30 (SUTURE) IMPLANT
SUT SILK 3 0 SH CR/8 (SUTURE) IMPLANT
SUT VIC AB 2-0 SH 18 (SUTURE) ×3 IMPLANT
SUT VIC AB 2-0 SH 27 (SUTURE)
SUT VIC AB 2-0 SH 27XBRD (SUTURE) IMPLANT
SUT VIC AB 3-0 SH 27 (SUTURE) ×4
SUT VIC AB 3-0 SH 27X BRD (SUTURE) ×2 IMPLANT
SUT VIC AB 3-0 SH 8-18 (SUTURE) IMPLANT
SUT VICRYL 3 0 UR 6 27 (SUTURE) IMPLANT
SYR 10ML LL (SYRINGE) ×3 IMPLANT
SYR BULB IRRIG 60ML STRL (SYRINGE) IMPLANT
SYR CONTROL 10ML LL (SYRINGE) ×3 IMPLANT
TAPE CLOTH SURG 4X10 WHT LF (GAUZE/BANDAGES/DRESSINGS) ×3 IMPLANT
TOWEL OR 17X26 10 PK STRL BLUE (TOWEL DISPOSABLE) ×3 IMPLANT
TRAY DSU PREP LF (CUSTOM PROCEDURE TRAY) ×3 IMPLANT
TUBE CONNECTING 12'X1/4 (SUCTIONS) ×1
TUBE CONNECTING 12X1/4 (SUCTIONS) ×2 IMPLANT
YANKAUER SUCT BULB TIP NO VENT (SUCTIONS) ×3 IMPLANT

## 2019-12-20 NOTE — Anesthesia Postprocedure Evaluation (Signed)
Anesthesia Post Note  Patient: Daniel Hansen  Procedure(s) Performed: LIGATION OF INTERNAL FISTULA TRACT (N/A Rectum)     Patient location during evaluation: PACU Anesthesia Type: General Level of consciousness: awake and alert Pain management: pain level controlled Vital Signs Assessment: post-procedure vital signs reviewed and stable Respiratory status: spontaneous breathing, nonlabored ventilation, respiratory function stable and patient connected to nasal cannula oxygen Cardiovascular status: blood pressure returned to baseline and stable Postop Assessment: no apparent nausea or vomiting Anesthetic complications: no   No complications documented.  Last Vitals:  Vitals:   12/20/19 1100 12/20/19 1112  BP: 131/61 (!) 132/59  Pulse: 70 70  Resp: (!) 9 13  Temp:    SpO2: 99% 97%    Last Pain:  Vitals:   12/20/19 1100  TempSrc:   PainSc: 0-No pain                 [name]

## 2019-12-20 NOTE — Progress Notes (Addendum)
Unable to retract foreskin to place foley cath due to inability to void. Dr. Marcello Moores aware and will call urology consult. Wife aware of urology consult.

## 2019-12-20 NOTE — Progress Notes (Signed)
New AVS printed and reviewed with wife with addition of new prescription. Aware of Valium dose given and time interval for next dose if needed.

## 2019-12-20 NOTE — H&P (Signed)
The patient is a 69 year old male who presents with anal fistula. 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 This has been present since 2015 when he developed a retrorectal abscess, which required a drainage catheter. He then developed a fistula which was treated at [facility] with a fistula plug. The plug fell out and he was taken back to the operating room in May 2016 for seton placement. It appears that he then developed an abscess in the left lateral perirectal space, which was drained in the operating room in June 2018. A Penrose catheter was placed and then taken out, several weeks later. Since that time he has had an indwelling seton with continued purulent drainage.   Past Surgical History (Tanisha A. Owens Shark, [place]; 11/13/2019 9:59 AM) Anal Fissure Repair   Allergies (Tanisha A. Owens Shark, [place]; 11/13/2019 9:59 AM) Penicillins  Allergies Reconciled   Medication History (Tanisha A. Brown, [place]; 11/13/2019 10:00 AM) PHENobarbital (32.4MG  Tablet, Oral) Active. Diphenoxylate-Atropine (2.5-0.025MG  Tablet, Oral) Active. Atorvastatin Calcium (80MG  Tablet, Oral) Active. Glimepiride (4MG  Tablet, Oral) Active. Loperamide HCl (2MG  Capsule, Oral) Active. Losartan Potassium-HCTZ (100-25MG  Tablet, Oral) Active. Meclizine HCl (25MG  Tablet, Oral) Active. Phenytoin Sodium Extended (100MG  Capsule, Oral) Active. Medications Reconciled  Social History (Tanisha A. Owens Shark, [place]; 11/13/2019 9:59 AM) No alcohol use  No caffeine use  No drug use  Tobacco use  Never smoker.  Family History (Tanisha A. Owens Shark, [place]; 11/13/2019 9:59 AM) Cerebrovascular Accident  Father.    Review of Systems  General Not Present- Appetite Loss, Chills, Fatigue, Fever, Night Sweats, Weight Gain and Weight Loss. Skin Present- Non-Healing Wounds. Not Present- Change in Wart/Mole, Dryness, Hives, Jaundice, New Lesions, Rash and Ulcer. HEENT Not Present- Earache, Hearing Loss,  Hoarseness, Nose Bleed, Oral Ulcers, Ringing in the Ears, Seasonal Allergies, Sinus Pain, Sore Throat, Visual Disturbances, Wears glasses/contact lenses and Yellow Eyes. Respiratory Not Present- Bloody sputum, Chronic Cough, Difficulty Breathing, Snoring and Wheezing. Breast Not Present- Breast Mass, Breast Pain, Nipple Discharge and Skin Changes. Cardiovascular Not Present- Chest Pain, Difficulty Breathing Lying Down, Leg Cramps, Palpitations, Rapid Heart Rate, Shortness of Breath and Swelling of Extremities. Gastrointestinal Not Present- Abdominal Pain, Bloating, Bloody Stool, Change in Bowel Habits, Chronic diarrhea, Constipation, Difficulty Swallowing, Excessive gas, Gets full quickly at meals, Hemorrhoids, Indigestion, Nausea, Rectal Pain and Vomiting. Male Genitourinary Not Present- Blood in Urine, Change in Urinary Stream, Frequency, Impotence, Nocturia, Painful Urination, Urgency and Urine Leakage.  BP 126/64   Pulse 60   Temp 98.8 F (37.1 C) (Oral)   Resp 18   Ht 5\' 6"  (1.676 m)   Wt 93.7 kg   SpO2 100%   BMI 33.35 kg/m     Physical Exam  General Mental Status-Alert. General Appearance-Cooperative. CV: RRR Lungs: CTA Rectal Anorectal Exam External - Note: Large amount of granulation tissue around the external opening of a seton with purulent drainage noted.    Assessment & Plan  ANAL FISTULA (K60.3) Impression: 69 year old male who presents to the office for evaluation of an indwelling anal seton that has been present since June 2018. He reports persistent purulent drainage. On exam, he has an intact seton with a left posterior lateral anal fistula. We discussed ligation of internal fistula tract as a possible treatment. We discussed that this has an approximately 80% chance of success. An approximately 20% need further procedures. We also discussed performing a fistulotomy. I think after reviewing the notes from his previous operations that this will require  resection of  quite a bit of his sphincter complex and he would be at risk for incontinence. This would have a 98% chance of resolving his fistula, though. These options were presented to the patient and he would like to think about it. He will call the office if he decides to proceed with either surgery.

## 2019-12-20 NOTE — Consult Note (Signed)
Urology Consult  Consulting MD: Joyice Faster, MD  CC: Inability to urinate, difficult catheter placement  HPI: This is a 69year old male who underwent anal surgery earlier today here in the outpatient center by Dr. Leighton Ruff.  The patient has not been able to void.  Because of significant phimosis, catheter placement by the nursing staff was unsuccessful and urologic consultation is requested.  The patient at first did not state he had any problems with his foreskin.  This has not been a bother to him.  Apparently, there is no problems with urination and he has not seen a urologist before for any process.  PMH: Past Medical History:  Diagnosis Date   Anal fissure    Arthritis    back   Cancer ([place])    melanoma removed from back 30 yrs ago   Epilepsy ([place])    Hyperlipemia    Hypertension    Seizure ([place])    last seizure 30 yrs ago   Type 2 diabetes mellitus ([place])     PSH: Past Surgical History:  Procedure Laterality Date   ANAL FISSURE REPAIR     5 or 6 times   melanoma removed from back  30 yrs ago    Allergies: Allergies  Allergen Reactions   Penicillins Hives    Medications: No medications prior to admission.     Social History: Social History   Socioeconomic History   Marital status: Married    Spouse name: Not on file   Number of children: Not on file   Years of education: Not on file   Highest education level: Not on file  Occupational History   Not on file  Tobacco Use   Smoking status: Never Smoker   Smokeless tobacco: Never Used  Vaping Use   Vaping Use: Never used  Substance and Sexual Activity   Alcohol use: Not Currently   Drug use: Never   Sexual activity: Not on file  Other Topics Concern   Not on file  Social History Narrative   Not on file   Social Determinants of Health   Financial Resource Strain:    Difficulty of Paying Living Expenses:   Food Insecurity:    Worried About Charity fundraiser in the  Last Year:    Arboriculturist in the Last Year:   Transportation Needs:    Film/video editor (Medical):    Lack of Transportation (Non-Medical):   Physical Activity:    Days of Exercise per Week:    Minutes of Exercise per Session:   Stress:    Feeling of Stress :   Social Connections:    Frequency of Communication with Friends and Family:    Frequency of Social Gatherings with Friends and Family:    Attends Religious Services:    Active Member of Clubs or Organizations:    Attends Music therapist:    Marital Status:   Intimate Partner Violence:    Fear of Current or Ex-Partner:    Emotionally Abused:    Physically Abused:    Sexually Abused:     Family History: History reviewed. No pertinent family history.  Review of Systems: Positive: anal pain Negative: .  A further 10 point review of systems was negative except what is listed in the HPI.  Physical Exam: @VITALS2 @ General: No acute distress.  Awake. Head:  Normocephalic.  Atraumatic. ENT:  EOMI.  Mucous membranes moist Neck:  Supple.  No lymphadenopathy. CV:  Regular rate.  Pulmonary: Equal effort bilaterally.   Skin:  Normal turgor.  No visible rash. Extremity: No gross deformity of extremities.  Neurologic: Alert. Appropriate mood.  Penis:  Uncircumcised with significant phimosis Scrotum: No lesions.  No ecchymosis.  No erythema. Testicles: Descended bilaterally.  No masses bilaterally. Epididymis: Palpable bilaterally.  Non Tender to palpation.  Studies:  Recent Labs    12/20/19 0716  HGB 11.2*    Recent Labs    12/20/19 0716  NA 138  K 3.3*  CL 97*  BUN 29*  CREATININE 0.90     No results for input(s): INR, APTT in the last 72 hours.  Invalid input(s): PT   Invalid input(s): ABG  I was unable to retract the foreskin as he has severe phimosis.  His penis was prepped and draped.  2% viscous lidocaine was introduced into what I thought was his urethra.  An  19 French coud tip catheter was utilized.  It took approximately 5 to 10 minutes, but I eventually found the patient's urethral meatus by blind probing.  I then passed the catheter without difficulty into the bladder with about 800 cc of amber urine obtained.  Balloon inflated with 10 mL of water and this was hooked to dependent drainage.  The patient tolerated the procedure well.  Assessment: 1.  Severe phimosis 2.  Urinary retention following anal surgery  Plan: The patient, other than having retention more than likely from his anal surgery, I think would be fine following up with Dr. Marcello Moores for voiding trial.  If he has any issues after that, follow-up in our office would be worthwhile.    [phone]

## 2019-12-20 NOTE — Progress Notes (Signed)
Pt attempted to void x 2 without success. Bladder scan reveals 372 urine retention. Dr. Marcello Moores notified, orders obtained for valium 5 mg PO x 1, wait 1 hour and if unable to void I&O cath. Orders enacted. Will continue to monitor.

## 2019-12-20 NOTE — Transfer of Care (Signed)
Immediate Anesthesia Transfer of Care Note  Patient: Daniel Hansen  Procedure(s) Performed: Procedure(s) (LRB): LIGATION OF INTERNAL FISTULA TRACT (N/A)  Patient Location: PACU  Anesthesia Type: General  Level of Consciousness: awake, oriented, sedated and patient cooperative  Airway & Oxygen Therapy: Patient Spontanous Breathing and Patient connected to face mask oxygen  Post-op Assessment: Report given to PACU RN and Post -op Vital signs reviewed and stable  Post vital signs: Reviewed and stable  Complications: No apparent anesthesia complications Last Vitals:  Vitals Value Taken Time  BP 111/71 12/20/19 1035  Temp    Pulse 77 12/20/19 1039  Resp 15 12/20/19 1039  SpO2 100 % 12/20/19 1039  Vitals shown include unvalidated device data.  Last Pain:  Vitals:   12/20/19 0727  TempSrc: Oral  PainSc: 0-No pain      Patients Stated Pain Goal: 3 (12/20/19 0727)

## 2019-12-20 NOTE — Discharge Instructions (Addendum)
ANORECTAL SURGERY: POST OP INSTRUCTIONS 1. Take your usually prescribed home medications unless otherwise directed. 2. DIET: During the first few hours after surgery sip on some liquids until you are able to urinate.  It is normal to not urinate for several hours after this surgery.  If you feel uncomfortable, please contact the office for instructions.  After you are able to urinate,you may eat, if you feel like it.  Follow a light bland diet the first 24 hours after arrival home, such as soup, liquids, crackers, etc.  Be sure to include lots of fluids daily (6-8 glasses).  Avoid fast food or heavy meals, as your are more likely to get nauseated.  Eat a low fat diet the next few days after surgery.  Limit caffeine intake to 1-2 servings a day. 3. PAIN CONTROL: a. Pain is best controlled by a usual combination of several different methods TOGETHER: i. Muscle relaxation: Soak in a warm bath (or Sitz bath) three times a day and after bowel movements.  Continue to do this until all pain is resolved.  ii. Over the counter pain medication iii. Prescription pain medication b. Most patients will experience some swelling and discomfort in the anus/rectal area and incisions.  Heat such as warm towels, sitz baths, warm baths, etc to help relax tight/sore spots and speed recovery.  Some people prefer to use ice, especially in the first couple days after surgery, as it may decrease the pain and swelling, or alternate between ice & heat.  Experiment to what works for you.  Swelling and bruising can take several weeks to resolve.  Pain can take even longer to completely resolve. c. It is helpful to take an over-the-counter pain medication regularly for the first few weeks.  Choose one of the following that works best for you: i. Naproxen (Aleve, etc)  Two 220mg  tabs twice a day ii. Ibuprofen (Advil, etc) Three 200mg  tabs four times a day (every meal & bedtime) d. A  prescription for pain medication (such as percocet,  oxycodone, hydrocodone, etc) should be given to you upon discharge.  Take your pain medication as prescribed.  i. If you are having problems/concerns with the prescription medicine (does not control pain, nausea, vomiting, rash, itching, etc), please call us [phone] to see if we need to switch you to a different pain medicine that will work better for you and/or control your side effect better. ii. If you need a refill on your pain medication, please contact your pharmacy.  They will contact our office to request authorization. Prescriptions will not be filled after 5 pm or on week-ends. 4. KEEP YOUR BOWELS REGULAR and AVOID CONSTIPATION a. The goal is one to two soft bowel movements a day.  You should at least have a bowel movement every other day. b. Avoid getting constipated.  Between the surgery and the pain medications, it is common to experience some constipation. This can be very painful after rectal surgery.  Increasing fluid intake and taking a fiber supplement (such as Metamucil, Citrucel, FiberCon, etc) 1-2 times a day regularly will usually help prevent this problem from occurring.  A stool softener like colace is also recommended.  This can be purchased over the counter at your pharmacy.  You can take it up to 3 times a day.  If you do not have a bowel movement after 24 hrs since your surgery, take one does of milk of magnesia.  If you still haven't had a bowel movement 8-12 hours  after that dose, take another dose.  If you don't have a bowel movement 48 hrs after surgery, purchase a Fleets enema from the drug store and administer gently per package instructions.  If you still are having trouble with your bowel movements after that, please call the office for further instructions. c. If you develop diarrhea or have many loose bowel movements, simplify your diet to bland foods & liquids for a few days.  Stop any stool softeners and decrease your fiber supplement.  Switching to mild  anti-diarrheal medications (Kayopectate, Pepto Bismol) can help.  If this worsens or does not improve, please call us.  5. Wound Care a. Remove your bandages before your first bowel movement or 8 hours after surgery.     b. Remove any wound packing material at this tim,e as well.  You do not need to repack the wound unless instructed otherwise.  Wear an absorbent pad or soft cotton gauze in your underwear to catch any drainage and help keep the area clean. You should change this every 2-3 hours while awake. c. Keep the area clean and dry.  Bathe / shower every day, especially after bowel movements.  Keep the area clean by showering / bathing over the incision / wound.   It is okay to soak an open wound to help wash it.  Wet wipes or showers / gentle washing after bowel movements is often less traumatic than regular toilet paper. d. Dennis Bast may have some styrofoam-like soft packing in the rectum which will come out with the first bowel movement.  e. You will often notice bleeding with bowel movements.  This should slow down by the end of the first week of surgery f. Expect some drainage.  This should slow down, too, by the end of the first week of surgery.  Wear an absorbent pad or soft cotton gauze in your underwear until the drainage stops. g. Do Not sit on a rubber or pillow ring.  This can make you symptoms worse.  You may sit on a soft pillow if needed.  6. ACTIVITIES as tolerated:   a. You may resume regular (light) daily activities beginning the next day--such as daily self-care, walking, climbing stairs--gradually increasing activities as tolerated.  If you can walk 30 minutes without difficulty, it is safe to try more intense activity such as jogging, treadmill, bicycling, low-impact aerobics, swimming, etc. b. Save the most intensive and strenuous activity for last such as sit-ups, heavy lifting, contact sports, etc  Refrain from any heavy lifting or straining until you are off narcotics for pain  control.   c. You may drive when you are no longer taking prescription pain medication, you can comfortably sit for long periods of time, and you can safely maneuver your car and apply brakes. d. Dennis Bast may have sexual intercourse when it is comfortable.  7. FOLLOW UP in our office a. Please call [facility] at [phone] to set up an appointment to see your surgeon in the office for a follow-up appointment approximately 3-4 weeks after your surgery. b. Make sure that you call for this appointment the day you arrive home to insure a convenient appointment time. 10. IF YOU HAVE DISABILITY OR FAMILY LEAVE FORMS, BRING THEM TO THE OFFICE FOR PROCESSING.  DO NOT GIVE THEM TO YOUR DOCTOR.     WHEN TO CALL us [phone]: 1. Poor pain control 2. Reactions / problems with new medications (rash/itching, nausea, etc)  3. Fever over 101.5 F (38.5 C)  4. Inability to urinate 5. Nausea and/or vomiting 6. Worsening swelling or bruising 7. Continued bleeding from incision. 8. Increased pain, redness, or drainage from the incision  The clinic staff is available to answer your questions during regular business hours (8:30am-5pm).  Please don't hesitate to call and ask to speak to one of our nurses for clinical concerns.   A surgeon from [facility] is always on call at the hospitals   If you have a medical emergency, go to the nearest emergency room or call 911.    [facility], [place], [place], [place], [place]  [address] ? MAIN: [phone] ? TOLL FREE: [phone] ? FAX [phone] [email or web address]     Post Anesthesia Home Care Instructions  Activity: Get plenty of rest for the remainder of the day. A responsible individual must stay with you for 24 hours following the procedure.  For the next 24 hours, DO NOT: -Drive a car -Paediatric nurse -Drink alcoholic beverages -Take any medication unless instructed by your  physician -Make any legal decisions or sign important papers.  Meals: Start with liquid foods such as gelatin or soup. Progress to regular foods as tolerated. Avoid greasy, spicy, heavy foods. If nausea and/or vomiting occur, drink only clear liquids until the nausea and/or vomiting subsides. Call your physician if vomiting continues.  Special Instructions/Symptoms: Your throat may feel dry or sore from the anesthesia or the breathing tube placed in your throat during surgery. If this causes discomfort, gargle with warm salt water. The discomfort should disappear within 24 hours.   Do not take any Tylenol until after 1:00 pm   Indwelling Urinary Catheter Care, Adult An indwelling urinary catheter is a thin tube that is put into your bladder. The tube helps to drain pee (urine) out of your body. The tube goes in through your urethra. Your urethra is where pee comes out of your body. Your pee will come out through the catheter, then it will go into a bag (drainage bag). Take good care of your catheter so it will work well. How to wear your catheter and bag Supplies needed  Sticky tape (adhesive tape) or a leg strap.  Alcohol wipe or soap and water (if you use tape).  A clean towel (if you use tape).  Large overnight bag.  Smaller bag (leg bag). Wearing your catheter Attach your catheter to your leg with tape or a leg strap.  Make sure the catheter is not pulled tight.  If a leg strap gets wet, take it off and put on a dry strap.  If you use tape to hold the bag on your leg: 1. Use an alcohol wipe or soap and water to wash your skin where the tape made it sticky before. 2. Use a clean towel to pat-dry that skin. 3. Use new tape to make the bag stay on your leg. Wearing your bags You should have been given a large overnight bag.  You may wear the overnight bag in the day or night.  Always have the overnight bag lower than your bladder.  Do not let the bag touch the  floor.  Before you go to sleep, put a clean plastic bag in a wastebasket. Then hang the overnight bag inside the wastebasket. You should also have a smaller leg bag that fits under your clothes.  Always wear the leg bag below your knee.  Do not wear your leg bag at night. How to care for your  skin and catheter Supplies needed  A clean washcloth.  Water and mild soap.  A clean towel. Caring for your skin and catheter      Clean the skin around your catheter every day: 1. Wash your hands with soap and water. 2. Wet a clean washcloth in warm water and mild soap. 3. Clean the skin around your urethra.  If you are male:  Gently spread the folds of skin around your vagina (labia).  With the washcloth in your other hand, wipe the inner side of your labia on each side. Wipe from front to back.  If you are male:  Pull back any skin that covers the end of your penis (foreskin).  With the washcloth in your other hand, wipe your penis in small circles. Start wiping at the tip of your penis, then move away from the catheter.  Move the foreskin back in place, if needed. 4. With your free hand, hold the catheter close to where it goes into your body.  Keep holding the catheter during cleaning so it does not get pulled out. 5. With the washcloth in your other hand, clean the catheter.  Only wipe downward on the catheter.  Do not wipe upward toward your body. Doing this may push germs into your urethra and cause infection. 6. Use a clean towel to pat-dry the catheter and the skin around it. Make sure to wipe off all soap. 7. Wash your hands with soap and water.  Shower every day. Do not take baths.  Do not use cream, ointment, or lotion on the area where the catheter goes into your body, unless your doctor tells you to.  Do not use powders, sprays, or lotions on your genital area.  Check your skin around the catheter every day for signs of infection. Check for: ? Redness,  swelling, or pain. ? Fluid or blood. ? Warmth. ? Pus or a bad smell. How to empty the bag Supplies needed  Rubbing alcohol.  Gauze pad or cotton ball.  Tape or a leg strap. Emptying the bag Pour the pee out of your bag when it is ?- full, or at least 2-3 times a day. Do this for your overnight bag and your leg bag. 1. Wash your hands with soap and water. 2. Separate (detach) the bag from your leg. 3. Hold the bag over the toilet or a clean pail. Keep the bag lower than your hips and bladder. This is so the pee (urine) does not go back into the tube. 4. Open the pour spout. It is at the bottom of the bag. 5. Empty the pee into the toilet or pail. Do not let the pour spout touch any surface. 6. Put rubbing alcohol on a gauze pad or cotton ball. 7. Use the gauze pad or cotton ball to clean the pour spout. 8. Close the pour spout. 9. Attach the bag to your leg with tape or a leg strap. 10. Wash your hands with soap and water. Follow instructions for cleaning the drainage bag:  From the product maker.  As told by your doctor. How to change the bag Supplies needed  Alcohol wipes.  A clean bag.  Tape or a leg strap. Changing the bag Replace your bag when it starts to leak, smell bad, or look dirty. 1. Wash your hands with soap and water. 2. Separate the dirty bag from your leg. 3. Pinch the catheter with your fingers so that pee does not spill out. 4. Separate  the catheter tube from the bag tube where these tubes connect (at the connection valve). Do not let the tubes touch any surface. 5. Clean the end of the catheter tube with an alcohol wipe. Use a different alcohol wipe to clean the end of the bag tube. 6. Connect the catheter tube to the tube of the clean bag. 7. Attach the clean bag to your leg with tape or a leg strap. Do not make the bag tight on your leg. 8. Wash your hands with soap and water. General rules   Never pull on your catheter. Never try to take it out.  Doing that can hurt you.  Always wash your hands before and after you touch your catheter or bag. Use a mild, fragrance-free soap. If you do not have soap and water, use hand sanitizer.  Always make sure there are no twists or bends (kinks) in the catheter tube.  Always make sure there are no leaks in the catheter or bag.  Drink enough fluid to keep your pee pale yellow.  Do not take baths, swim, or use a hot tub.  If you are male, wipe from front to back after you poop (have a bowel movement). Contact a doctor if:  Your pee is cloudy.  Your pee smells worse than usual.  Your catheter gets clogged.  Your catheter leaks.  Your bladder feels full. Get help right away if:  You have redness, swelling, or pain where the catheter goes into your body.  You have fluid, blood, pus, or a bad smell coming from the area where the catheter goes into your body.  Your skin feels warm where the catheter goes into your body.  You have a fever.  You have pain in your: ? Belly (abdomen). ? Legs. ? Lower back. ? Bladder.  You see blood in the catheter.  Your pee is pink or red.  You feel sick to your stomach (nauseous).  You throw up (vomit).  You have chills.  Your pee is not draining into the bag.  Your catheter gets pulled out. Summary  An indwelling urinary catheter is a thin tube that is placed into the bladder to help drain pee (urine) out of the body.  The catheter is placed into the part of the body that drains pee from the bladder (urethra).  Taking good care of your catheter will keep it working properly and help prevent problems.  Always wash your hands before and after touching your catheter or bag.  Never pull on your catheter or try to take it out. This information is not intended to replace advice given to you by your health care provider. Make sure you discuss any questions you have with your health care provider. Document Revised: 09/22/2018 Document  Reviewed: 01/14/2017 Elsevier Patient Education  [facility]' NURSE WILL CALL YOU TO SCHEDULE CATHETER REMOVAL FOR MONDAY/TUESDAY OF NEXT WEEK. PLEASE CALL HER OFFICE TOMORROW IF YOU HAVE NOT HEARD FROM THEM.

## 2019-12-20 NOTE — Anesthesia Procedure Notes (Signed)
Procedure Name: Intubation Date/Time: 12/20/2019 9:10 AM Performed by: Suan Halter, CRNA Pre-anesthesia Checklist: Patient identified, Emergency Drugs available, Suction available and Patient being monitored Patient Re-evaluated:Patient Re-evaluated prior to induction Oxygen Delivery Method: Circle system utilized Preoxygenation: Pre-oxygenation with 100% oxygen Induction Type: IV induction Ventilation: Mask ventilation without difficulty Laryngoscope Size: Mac and 4 Grade View: Grade I Tube type: Oral Tube size: 7.5 mm Number of attempts: 1 Airway Equipment and Method: Stylet and Oral airway Placement Confirmation: ETT inserted through vocal cords under direct vision,  positive ETCO2 and breath sounds checked- equal and bilateral Secured at: 24 cm Tube secured with: Tape Dental Injury: Teeth and Oropharynx as per pre-operative assessment

## 2019-12-20 NOTE — Anesthesia Preprocedure Evaluation (Signed)
Anesthesia Evaluation  Patient identified by MRN, date of birth, ID band Patient awake    Reviewed: Allergy & Precautions, NPO status , Patient's Chart, lab work & pertinent test results  Airway Mallampati: II  TM Distance: >3 FB Neck ROM: Full    Dental no notable dental hx.    Pulmonary neg pulmonary ROS,    Pulmonary exam normal breath sounds clear to auscultation       Cardiovascular hypertension, Pt. on medications Normal cardiovascular exam Rhythm:Regular Rate:Normal     Neuro/Psych Seizures -, Well Controlled,  negative psych ROS   GI/Hepatic negative GI ROS, Neg liver ROS,   Endo/Other  diabetes  Renal/GU negative Renal ROS  negative genitourinary   Musculoskeletal negative musculoskeletal ROS (+)   Abdominal   Peds negative pediatric ROS (+)  Hematology negative hematology ROS (+)   Anesthesia Other Findings   Reproductive/Obstetrics negative OB ROS                             Anesthesia Physical Anesthesia Plan  ASA: II  Anesthesia Plan: General   Post-op Pain Management:    Induction: Intravenous  PONV Risk Score and Plan: 2 and Ondansetron, Dexamethasone and Treatment may vary due to age or medical condition  Airway Management Planned: Oral ETT  Additional Equipment:   Intra-op Plan:   Post-operative Plan: Extubation in OR  Informed Consent: I have reviewed the patients History and Physical, chart, labs and discussed the procedure including the risks, benefits and alternatives for the proposed anesthesia with the patient or authorized representative who has indicated his/her understanding and acceptance.     Dental advisory given  Plan Discussed with: CRNA and Surgeon  Anesthesia Plan Comments:         Anesthesia Quick Evaluation

## 2019-12-20 NOTE — Op Note (Signed)
12/20/2019  10:27 AM  PATIENT:  Daniel Hansen  69 y.o. male  Patient Care Team: Idelle Crouch, MD as PCP - General (Internal Medicine)  PRE-OPERATIVE DIAGNOSIS:  ANAL FISTULA  POST-OPERATIVE DIAGNOSIS:  ANAL FISTULA  PROCEDURE:   LIGATION OF INTERNAL FISTULA TRACT    Surgeon(s): Leighton Ruff, MD  ASSISTANT: none   ANESTHESIA:   local and general  SPECIMEN:  No Specimen  DISPOSITION OF SPECIMEN:  N/A  COUNTS:  YES  PLAN OF CARE: Discharge to home after PACU  PATIENT DISPOSITION:  PACU - hemodynamically stable.  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  General anesthesia was induced without difficulty. The patient was then positioned in prone jackknife position with buttocks gently taped apart.  The patient was then prepped and draped in usual sterile fashion.  SCDs were noted to be in place prior to the initiation of anesthesia. A surgical timeout was performed indicating the correct patient, procedure, positioning and need for preoperative antibiotics.  A rectal block was performed using Marcaine with epinephrine mixed with Experel.    I began with a digital rectal exam.    I then placed a Hill-Ferguson anoscope into the anal canal and evaluated this completely.  The patient had an internal opening at posterior midline and a rather large cavity that tracked towards the patient's right side.  I placed an S-shaped fistula probe through the fistula tract and remove the seton.  I made an incision in the intersphincteric groove using electrocautery at the level of the internal opening.  I dissected down in between the sphincter complex using blunt dissection.  I used a right angle to get underneath of the fistula tract and then tied this off with 2, 2-0  silk sutures.  I evaluated my closure by injecting hydrogen peroxide into the fistula tract.  There was one leak noted posteriorly.  I placed 2 more 2-0 chromic sutures beneath the fistula tract closure and tested again with hydrogen peroxide.  There was no leak noted.  I placed a fistula probe into the tract and there was no further entrance into the anal canal.  I then closed the internal opening using a 3-0 Vicryl suture.  I reapproximated the intersphincteric groove using interrupted 2-0 Vicryl sutures.  The anoderm was closed using interrupted 2-0 chromic sutures.  The external opening was then enlarged using Bovie electrocautery.  Hemostasis was achieved also using electrocautery.  I placed a Gelfoam into the tract for additional hemostasis and then a dressing was applied with lidocaine ointment.  The patient was then awakened from anesthesia and sent to the postanesthesia care unit in stable condition.  All counts were correct per operating room staff.

## 2019-12-21 ENCOUNTER — Encounter (HOSPITAL_BASED_OUTPATIENT_CLINIC_OR_DEPARTMENT_OTHER): Payer: Self-pay | Admitting: General Surgery

## 2020-02-01 DIAGNOSIS — M542 Cervicalgia: Secondary | ICD-10-CM | POA: Diagnosis not present

## 2020-02-01 DIAGNOSIS — M62838 Other muscle spasm: Secondary | ICD-10-CM | POA: Diagnosis not present

## 2020-02-01 DIAGNOSIS — M9901 Segmental and somatic dysfunction of cervical region: Secondary | ICD-10-CM | POA: Diagnosis not present

## 2020-02-01 DIAGNOSIS — M5413 Radiculopathy, cervicothoracic region: Secondary | ICD-10-CM | POA: Diagnosis not present

## 2020-02-04 DIAGNOSIS — M5413 Radiculopathy, cervicothoracic region: Secondary | ICD-10-CM | POA: Diagnosis not present

## 2020-02-04 DIAGNOSIS — M62838 Other muscle spasm: Secondary | ICD-10-CM | POA: Diagnosis not present

## 2020-02-04 DIAGNOSIS — M9901 Segmental and somatic dysfunction of cervical region: Secondary | ICD-10-CM | POA: Diagnosis not present

## 2020-02-04 DIAGNOSIS — M542 Cervicalgia: Secondary | ICD-10-CM | POA: Diagnosis not present

## 2020-02-07 DIAGNOSIS — M542 Cervicalgia: Secondary | ICD-10-CM | POA: Diagnosis not present

## 2020-02-07 DIAGNOSIS — M5413 Radiculopathy, cervicothoracic region: Secondary | ICD-10-CM | POA: Diagnosis not present

## 2020-02-07 DIAGNOSIS — M62838 Other muscle spasm: Secondary | ICD-10-CM | POA: Diagnosis not present

## 2020-02-07 DIAGNOSIS — M9901 Segmental and somatic dysfunction of cervical region: Secondary | ICD-10-CM | POA: Diagnosis not present

## 2020-02-08 DIAGNOSIS — M542 Cervicalgia: Secondary | ICD-10-CM | POA: Diagnosis not present

## 2020-02-08 DIAGNOSIS — M62838 Other muscle spasm: Secondary | ICD-10-CM | POA: Diagnosis not present

## 2020-02-08 DIAGNOSIS — M5413 Radiculopathy, cervicothoracic region: Secondary | ICD-10-CM | POA: Diagnosis not present

## 2020-02-08 DIAGNOSIS — M9901 Segmental and somatic dysfunction of cervical region: Secondary | ICD-10-CM | POA: Diagnosis not present

## 2020-02-11 DIAGNOSIS — M51369 Other intervertebral disc degeneration, lumbar region without mention of lumbar back pain or lower extremity pain: Secondary | ICD-10-CM | POA: Insufficient documentation

## 2020-02-11 DIAGNOSIS — M5136 Other intervertebral disc degeneration, lumbar region: Secondary | ICD-10-CM | POA: Insufficient documentation

## 2020-02-12 ENCOUNTER — Other Ambulatory Visit
Admission: RE | Admit: 2020-02-12 | Discharge: 2020-02-12 | Disposition: A | Discharge status: Home / Self Care | Payer: Medicare HMO | Attending: Internal Medicine | Admitting: Internal Medicine

## 2020-02-12 DIAGNOSIS — M5136 Other intervertebral disc degeneration, lumbar region: Secondary | ICD-10-CM | POA: Diagnosis not present

## 2020-02-21 DIAGNOSIS — M5432 Sciatica, left side: Secondary | ICD-10-CM | POA: Diagnosis not present

## 2020-02-21 DIAGNOSIS — Z79899 Other long term (current) drug therapy: Secondary | ICD-10-CM | POA: Diagnosis not present

## 2020-02-21 DIAGNOSIS — R8290 Unspecified abnormal findings in urine: Secondary | ICD-10-CM | POA: Diagnosis not present

## 2020-02-25 LAB — MISC LABCORP TEST (SEND OUT): Labcorp test code: 6928

## 2020-02-25 LAB — HLA-B27 ANTIGEN: HLA-B27: NEGATIVE

## 2020-02-26 DIAGNOSIS — M5136 Other intervertebral disc degeneration, lumbar region: Secondary | ICD-10-CM | POA: Diagnosis not present

## 2020-03-21 ENCOUNTER — Ambulatory Visit: Payer: Self-pay | Admitting: General Surgery

## 2020-03-21 NOTE — H&P (View-Only) (Signed)
The patient is a 69 year old male who presents with anal fistula. 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 This has been present since 2015 when he developed a retrorectal abscess, which required a drainage catheter. He then developed a fistula which was treated at [facility] with a fistula plug. The plug fell out and he was taken back to the operating room in May 2016 for seton placement. It appears that he then developed an abscess in the left lateral perirectal space, which was drained in the operating room in June 2018. A Penrose catheter was placed and then taken out, several weeks later. Since that time he has had an indwelling seton with continued purulent drainage. We discussed performing a more definitive procedure to try to close his fistula. A LIFT procedure was performed on December 20, 2019. Since that time, he has been recovering at home. He did have some urinary retention which required a temporary Foley catheter. Unfortunately, he is still draining purulence.   Problem List/Past Medical Leighton Ruff, MD; 04/22/1477 9:05 AM) Gareth Morgan FOR FOLEY CATHETER REMOVAL (Z46.6) ANAL FISTULA (K60.3)  Past Surgical History Leighton Ruff, MD; 29/10/6211 9:05 AM) Orpah Cobb Fissure Repair  Allergies Darden Palmer, RMA; 03/21/2020 8:54 AM) Penicillins Allergies Reconciled  Medication History Darden Palmer, RMA; 03/21/2020 8:54 AM) PHENobarbital (32.4MG  Tablet, Oral) Active. Diphenoxylate-Atropine (2.5-0.025MG  Tablet, Oral) Active. Atorvastatin Calcium (80MG  Tablet, Oral) Active. Glimepiride (4MG  Tablet, Oral) Active. Loperamide HCl (2MG  Capsule, Oral) Active. Losartan Potassium-HCTZ (100-25MG  Tablet, Oral) Active. Meclizine HCl (25MG  Tablet, Oral) Active. Phenytoin Sodium Extended (100MG  Capsule, Oral) Active. Medications Reconciled  Social History Leighton Ruff, MD; 01/18/5783 9:05 AM) No alcohol use No caffeine use No drug use Tobacco  use Never smoker.  Family History Leighton Ruff, MD; 69/11/2950 9:05 AM) Cerebrovascular Accident Father.     Review of Systems Leighton Ruff MD; 84/06/3242 9:05 AM) General Not Present- Appetite Loss, Chills, Fatigue, Fever, Night Sweats, Weight Gain and Weight Loss. Skin Present- Non-Healing Wounds. Not Present- Change in Wart/Mole, Dryness, Hives, Jaundice, New Lesions, Rash and Ulcer. HEENT Not Present- Earache, Hearing Loss, Hoarseness, Nose Bleed, Oral Ulcers, Ringing in the Ears, Seasonal Allergies, Sinus Pain, Sore Throat, Visual Disturbances, Wears glasses/contact lenses and Yellow Eyes. Respiratory Not Present- Bloody sputum, Chronic Cough, Difficulty Breathing, Snoring and Wheezing. Breast Not Present- Breast Mass, Breast Pain, Nipple Discharge and Skin Changes. Cardiovascular Not Present- Chest Pain, Difficulty Breathing Lying Down, Leg Cramps, Palpitations, Rapid Heart Rate, Shortness of Breath and Swelling of Extremities. Gastrointestinal Not Present- Abdominal Pain, Bloating, Bloody Stool, Change in Bowel Habits, Chronic diarrhea, Constipation, Difficulty Swallowing, Excessive gas, Gets full quickly at meals, Hemorrhoids, Indigestion, Nausea, Rectal Pain and Vomiting. Male Genitourinary Not Present- Blood in Urine, Change in Urinary Stream, Frequency, Impotence, Nocturia, Painful Urination, Urgency and Urine Leakage.  Vitals Lattie Haw [place] RMA; 03/21/2020 8:54 AM) 03/21/2020 8:54 AM Weight: 200 lb Height: 66in Body Surface Area: 2 m Body Mass Index: 32.28 kg/m  Temp.: 97.50F  Pulse: 70 (Regular)  P.OX: 98% (Room air) BP: 124/70(Sitting, Left Arm, Standard)        Physical Exam Leighton Ruff MD; 01/0/2725 9:06 AM)  General Mental Status-Alert. General Appearance-Cooperative.  Rectal Note: Surgical site open with granulation tissue. No signs of surrounding cellulitis or fluctuance.    Assessment & Plan Leighton Ruff MD; 36/11/4401 9:08  AM)  ANAL FISTULA (K60.3) Impression: 69 year old male approximately 3 months status post LIFT procedure for long-standing anal fistula. It does appear that his fistula has recurred.  I have recommended an anal exam under anesthesia with possible mucosal advancement flap. Patient has agreed to try again to get his fistula to close.

## 2020-03-21 NOTE — H&P (Signed)
The patient is a 69 year old male who presents with anal fistula. 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 This has been present since 2015 when he developed a retrorectal abscess, which required a drainage catheter. He then developed a fistula which was treated at [facility] with a fistula plug. The plug fell out and he was taken back to the operating room in May 2016 for seton placement. It appears that he then developed an abscess in the left lateral perirectal space, which was drained in the operating room in June 2018. A Penrose catheter was placed and then taken out, several weeks later. Since that time he has had an indwelling seton with continued purulent drainage. We discussed performing a more definitive procedure to try to close his fistula. A LIFT procedure was performed on December 20, 2019. Since that time, he has been recovering at home. He did have some urinary retention which required a temporary Foley catheter. Unfortunately, he is still draining purulence.   Problem List/Past Medical Leighton Ruff, MD; 79/01/9210 9:05 AM) Gareth Morgan FOR FOLEY CATHETER REMOVAL (Z46.6) ANAL FISTULA (K60.3)  Past Surgical History Leighton Ruff, MD; 94/06/7406 9:05 AM) Orpah Cobb Fissure Repair  Allergies Darden Palmer, RMA; 03/21/2020 8:54 AM) Penicillins Allergies Reconciled  Medication History Darden Palmer, RMA; 03/21/2020 8:54 AM) PHENobarbital (32.4MG  Tablet, Oral) Active. Diphenoxylate-Atropine (2.5-0.025MG  Tablet, Oral) Active. Atorvastatin Calcium (80MG  Tablet, Oral) Active. Glimepiride (4MG  Tablet, Oral) Active. Loperamide HCl (2MG  Capsule, Oral) Active. Losartan Potassium-HCTZ (100-25MG  Tablet, Oral) Active. Meclizine HCl (25MG  Tablet, Oral) Active. Phenytoin Sodium Extended (100MG  Capsule, Oral) Active. Medications Reconciled  Social History Leighton Ruff, MD; 14/09/8183 9:05 AM) No alcohol use No caffeine use No drug use Tobacco  use Never smoker.  Family History Leighton Ruff, MD; 63/06/4968 9:05 AM) Cerebrovascular Accident Father.     Review of Systems Leighton Ruff MD; 26/08/7856 9:05 AM) General Not Present- Appetite Loss, Chills, Fatigue, Fever, Night Sweats, Weight Gain and Weight Loss. Skin Present- Non-Healing Wounds. Not Present- Change in Wart/Mole, Dryness, Hives, Jaundice, New Lesions, Rash and Ulcer. HEENT Not Present- Earache, Hearing Loss, Hoarseness, Nose Bleed, Oral Ulcers, Ringing in the Ears, Seasonal Allergies, Sinus Pain, Sore Throat, Visual Disturbances, Wears glasses/contact lenses and Yellow Eyes. Respiratory Not Present- Bloody sputum, Chronic Cough, Difficulty Breathing, Snoring and Wheezing. Breast Not Present- Breast Mass, Breast Pain, Nipple Discharge and Skin Changes. Cardiovascular Not Present- Chest Pain, Difficulty Breathing Lying Down, Leg Cramps, Palpitations, Rapid Heart Rate, Shortness of Breath and Swelling of Extremities. Gastrointestinal Not Present- Abdominal Pain, Bloating, Bloody Stool, Change in Bowel Habits, Chronic diarrhea, Constipation, Difficulty Swallowing, Excessive gas, Gets full quickly at meals, Hemorrhoids, Indigestion, Nausea, Rectal Pain and Vomiting. Male Genitourinary Not Present- Blood in Urine, Change in Urinary Stream, Frequency, Impotence, Nocturia, Painful Urination, Urgency and Urine Leakage.  Vitals Lattie Haw Boles RMA; 03/21/2020 8:54 AM) 03/21/2020 8:54 AM Weight: 200 lb Height: 66in Body Surface Area: 2 m Body Mass Index: 32.28 kg/m  Temp.: 97.25F  Pulse: 70 (Regular)  P.OX: 98% (Room air) BP: 124/70(Sitting, Left Arm, Standard)        Physical Exam Leighton Ruff MD; 85/0/2774 9:06 AM)  General Mental Status-Alert. General Appearance-Cooperative.  Rectal Note: Surgical site open with granulation tissue. No signs of surrounding cellulitis or fluctuance.    Assessment & Plan Leighton Ruff MD; 05/21/7866 9:08  AM)  ANAL FISTULA (K60.3) Impression: 69 year old male approximately 3 months status post LIFT procedure for long-standing anal fistula. It does appear that his fistula has recurred.  I have recommended an anal exam under anesthesia with possible mucosal advancement flap. Patient has agreed to try again to get his fistula to close.

## 2020-03-24 DIAGNOSIS — M7138 Other bursal cyst, other site: Secondary | ICD-10-CM | POA: Diagnosis not present

## 2020-03-24 DIAGNOSIS — M48062 Spinal stenosis, lumbar region with neurogenic claudication: Secondary | ICD-10-CM | POA: Diagnosis not present

## 2020-03-27 ENCOUNTER — Other Ambulatory Visit: Payer: Self-pay

## 2020-03-27 ENCOUNTER — Encounter (HOSPITAL_BASED_OUTPATIENT_CLINIC_OR_DEPARTMENT_OTHER): Payer: Self-pay | Admitting: General Surgery

## 2020-03-27 NOTE — Progress Notes (Signed)
Spoke w/ via phone for pre-op interview---Daniel Hansen Hardin Negus RN  Lab needs dos----Istat8 ,hgba1c   , keg             Lab results------none  COVID test -----03/31/2020 1100- Arrive at ------ 0530- NPO after MN NO Solid Food.  Clear liquids from MN until---0430am Medications to take morning of surgery -----dilantin, phenobarbital  Diabetic medication ----- Amaryl  Patient Special Instructions -----none  Pre-Op special Istructions -----none  Patient verbalized understanding of instructions that were given at this phone interview. Patient denies shortness of breath, chest pain, fever, cough at this phone interview.w via phone fCalled and requested most recent hgba1c result from Dr Dellis Filbert sparks.   Pt is HOH

## 2020-03-28 NOTE — Progress Notes (Signed)
hemaglobin a1c [id number] [facility] on chart

## 2020-03-31 ENCOUNTER — Other Ambulatory Visit (HOSPITAL_COMMUNITY)
Admission: RE | Admit: 2020-03-31 | Discharge: 2020-03-31 | Disposition: A | Discharge status: Home / Self Care | Payer: Medicare HMO | Source: Ambulatory Visit | Attending: Internal Medicine | Admitting: Internal Medicine

## 2020-03-31 DIAGNOSIS — Z01818 Encounter for other preprocedural examination: Secondary | ICD-10-CM | POA: Insufficient documentation

## 2020-03-31 DIAGNOSIS — Z20822 Contact with and (suspected) exposure to covid-19: Secondary | ICD-10-CM | POA: Diagnosis not present

## 2020-03-31 LAB — SARS CORONAVIRUS 2 (TAT 6-24 HRS): SARS Coronavirus 2: NEGATIVE

## 2020-04-02 NOTE — Progress Notes (Signed)
0530 arrival time confirmed w/ pt's spouse via phone.   Dorrene German, RN

## 2020-04-03 ENCOUNTER — Encounter (HOSPITAL_BASED_OUTPATIENT_CLINIC_OR_DEPARTMENT_OTHER)
Admission: RE | Disposition: A | Discharge status: Home / Self Care | Payer: Self-pay | Source: Home / Self Care | Attending: General Surgery

## 2020-04-03 ENCOUNTER — Encounter (HOSPITAL_BASED_OUTPATIENT_CLINIC_OR_DEPARTMENT_OTHER): Payer: Self-pay | Admitting: General Surgery

## 2020-04-03 ENCOUNTER — Ambulatory Visit (HOSPITAL_BASED_OUTPATIENT_CLINIC_OR_DEPARTMENT_OTHER): Payer: Medicare HMO | Admitting: Anesthesiology

## 2020-04-03 ENCOUNTER — Ambulatory Visit (HOSPITAL_BASED_OUTPATIENT_CLINIC_OR_DEPARTMENT_OTHER)
Admission: RE | Admit: 2020-04-03 | Discharge: 2020-04-03 | Disposition: A | Discharge status: Home / Self Care | Payer: Medicare HMO | Attending: General Surgery | Admitting: General Surgery

## 2020-04-03 DIAGNOSIS — E119 Type 2 diabetes mellitus without complications: Secondary | ICD-10-CM | POA: Diagnosis not present

## 2020-04-03 DIAGNOSIS — Z880 Allergy status to penicillin: Secondary | ICD-10-CM | POA: Diagnosis not present

## 2020-04-03 DIAGNOSIS — D649 Anemia, unspecified: Secondary | ICD-10-CM | POA: Diagnosis not present

## 2020-04-03 DIAGNOSIS — Z7984 Long term (current) use of oral hypoglycemic drugs: Secondary | ICD-10-CM | POA: Diagnosis not present

## 2020-04-03 DIAGNOSIS — K611 Rectal abscess: Secondary | ICD-10-CM | POA: Diagnosis not present

## 2020-04-03 DIAGNOSIS — R569 Unspecified convulsions: Secondary | ICD-10-CM | POA: Insufficient documentation

## 2020-04-03 DIAGNOSIS — I10 Essential (primary) hypertension: Secondary | ICD-10-CM | POA: Diagnosis not present

## 2020-04-03 DIAGNOSIS — E785 Hyperlipidemia, unspecified: Secondary | ICD-10-CM | POA: Diagnosis not present

## 2020-04-03 DIAGNOSIS — K603 Anal fistula: Secondary | ICD-10-CM | POA: Diagnosis present

## 2020-04-03 DIAGNOSIS — Z79899 Other long term (current) drug therapy: Secondary | ICD-10-CM | POA: Diagnosis not present

## 2020-04-03 HISTORY — PX: RECTAL EXAM UNDER ANESTHESIA: SHX6399

## 2020-04-03 LAB — POCT I-STAT, CHEM 8
BUN: 14 mg/dL (ref 8–23)
Calcium, Ion: 1.17 mmol/L (ref 1.15–1.40)
Chloride: 99 mmol/L (ref 98–111)
Creatinine, Ser: 0.8 mg/dL (ref 0.61–1.24)
Glucose, Bld: 112 mg/dL — ABNORMAL HIGH (ref 70–99)
HCT: 34 % — ABNORMAL LOW (ref 39.0–52.0)
Hemoglobin: 11.6 g/dL — ABNORMAL LOW (ref 13.0–17.0)
Potassium: 3.2 mmol/L — ABNORMAL LOW (ref 3.5–5.1)
Sodium: 141 mmol/L (ref 135–145)
TCO2: 32 mmol/L (ref 22–32)

## 2020-04-03 LAB — GLUCOSE, CAPILLARY: Glucose-Capillary: 122 mg/dL — ABNORMAL HIGH (ref 70–99)

## 2020-04-03 SURGERY — EXAM UNDER ANESTHESIA, RECTUM
Anesthesia: Monitor Anesthesia Care | Site: Rectum

## 2020-04-03 MED ORDER — ONDANSETRON HCL 4 MG/2ML IJ SOLN
INTRAMUSCULAR | Status: DC | PRN
  Administered 2020-04-03: 4 mg via INTRAVENOUS

## 2020-04-03 MED ORDER — PROPOFOL 500 MG/50ML IV EMUL
INTRAVENOUS | Status: AC
  Filled 2020-04-03: qty 50

## 2020-04-03 MED ORDER — SODIUM CHLORIDE 0.9% FLUSH: 3.0000 mL | Freq: Two times a day (BID) | INTRAVENOUS | Status: DC

## 2020-04-03 MED ORDER — DEXMEDETOMIDINE (PRECEDEX) IN NS 20 MCG/5ML (4 MCG/ML) IV SYRINGE
PREFILLED_SYRINGE | INTRAVENOUS | Status: AC
  Filled 2020-04-03: qty 5

## 2020-04-03 MED ORDER — FENTANYL CITRATE (PF) 100 MCG/2ML IJ SOLN: 25.0000 ug | INTRAMUSCULAR | Status: DC | PRN

## 2020-04-03 MED ORDER — BUPIVACAINE-EPINEPHRINE 0.5% -1:200000 IJ SOLN
INTRAMUSCULAR | Status: DC | PRN
  Administered 2020-04-03: 30 mL

## 2020-04-03 MED ORDER — FENTANYL CITRATE (PF) 100 MCG/2ML IJ SOLN
INTRAMUSCULAR | Status: DC | PRN
  Administered 2020-04-03: 12.5 ug via INTRAVENOUS
  Administered 2020-04-03: 25 ug via INTRAVENOUS
  Administered 2020-04-03: 50 ug via INTRAVENOUS
  Administered 2020-04-03: 12.5 ug via INTRAVENOUS

## 2020-04-03 MED ORDER — PROPOFOL 10 MG/ML IV BOLUS
INTRAVENOUS | Status: DC | PRN
  Administered 2020-04-03: 30 mg via INTRAVENOUS
  Administered 2020-04-03: 10 mg via INTRAVENOUS

## 2020-04-03 MED ORDER — BUPIVACAINE LIPOSOME 1.3 % IJ SUSP
INTRAMUSCULAR | Status: DC | PRN
  Administered 2020-04-03: 20 mL

## 2020-04-03 MED ORDER — GLYCOPYRROLATE 0.2 MG/ML IJ SOLN
INTRAMUSCULAR | Status: DC | PRN
  Administered 2020-04-03: .2 mg via INTRAVENOUS

## 2020-04-03 MED ORDER — KETOROLAC TROMETHAMINE 30 MG/ML IJ SOLN
INTRAMUSCULAR | Status: DC | PRN
  Administered 2020-04-03: 30 mg via INTRAVENOUS

## 2020-04-03 MED ORDER — DEXAMETHASONE SODIUM PHOSPHATE 4 MG/ML IJ SOLN
INTRAMUSCULAR | Status: DC | PRN
  Administered 2020-04-03: 5 mg via INTRAVENOUS

## 2020-04-03 MED ORDER — DEXMEDETOMIDINE HCL 200 MCG/2ML IV SOLN
INTRAVENOUS | Status: DC | PRN
  Administered 2020-04-03 (×3): 4 ug via INTRAVENOUS

## 2020-04-03 MED ORDER — LIDOCAINE 2% (20 MG/ML) 5 ML SYRINGE
INTRAMUSCULAR | Status: AC
  Filled 2020-04-03: qty 5

## 2020-04-03 MED ORDER — DEXAMETHASONE SODIUM PHOSPHATE 10 MG/ML IJ SOLN
INTRAMUSCULAR | Status: AC
  Filled 2020-04-03: qty 1

## 2020-04-03 MED ORDER — OXYCODONE HCL 5 MG/5ML PO SOLN: 5.0000 mg | Freq: Once | ORAL | Status: DC | PRN

## 2020-04-03 MED ORDER — ONDANSETRON HCL 4 MG/2ML IJ SOLN
INTRAMUSCULAR | Status: AC
  Filled 2020-04-03: qty 2

## 2020-04-03 MED ORDER — KETOROLAC TROMETHAMINE 30 MG/ML IJ SOLN
INTRAMUSCULAR | Status: AC
  Filled 2020-04-03: qty 1

## 2020-04-03 MED ORDER — OXYCODONE HCL 5 MG PO TABS
5.0000 mg | ORAL_TABLET | Freq: Four times a day (QID) | ORAL | 0 refills | Status: DC | PRN
Start: 2020-04-03 — End: 2023-04-14

## 2020-04-03 MED ORDER — LIDOCAINE HCL (CARDIAC) PF 100 MG/5ML IV SOSY
PREFILLED_SYRINGE | INTRAVENOUS | Status: DC | PRN
  Administered 2020-04-03: 60 mg via INTRAVENOUS

## 2020-04-03 MED ORDER — LIDOCAINE 5 % EX OINT
TOPICAL_OINTMENT | CUTANEOUS | Status: DC | PRN
  Administered 2020-04-03: 1

## 2020-04-03 MED ORDER — FENTANYL CITRATE (PF) 100 MCG/2ML IJ SOLN
INTRAMUSCULAR | Status: AC
  Filled 2020-04-03: qty 2

## 2020-04-03 MED ORDER — LACTATED RINGERS IV SOLN: INTRAVENOUS | Status: DC

## 2020-04-03 MED ORDER — OXYCODONE HCL 5 MG PO TABS: 5.0000 mg | ORAL_TABLET | Freq: Once | ORAL | Status: DC | PRN

## 2020-04-03 MED ORDER — PROPOFOL 500 MG/50ML IV EMUL
INTRAVENOUS | Status: DC | PRN
  Administered 2020-04-03: 50 ug/kg/min via INTRAVENOUS

## 2020-04-03 SURGICAL SUPPLY — 66 items
28 malecot model drain ×4 IMPLANT
APL SKNCLS STERI-STRIP NONHPOA (GAUZE/BANDAGES/DRESSINGS) ×4
BENZOIN TINCTURE PRP APPL 2/3 (GAUZE/BANDAGES/DRESSINGS) ×8 IMPLANT
BLADE EXTENDED COATED 6.5IN (ELECTRODE) IMPLANT
BLADE HEX COATED 2.75 (ELECTRODE) ×4 IMPLANT
BLADE SURG 10 STRL SS (BLADE) ×4 IMPLANT
BRIEF STRETCH FOR OB PAD LRG (UNDERPADS AND DIAPERS) ×4 IMPLANT
CANISTER SUCT 3000ML PPV (MISCELLANEOUS) ×4 IMPLANT
COVER BACK TABLE 60X90IN (DRAPES) ×4 IMPLANT
COVER MAYO STAND STRL (DRAPES) ×4 IMPLANT
COVER WAND RF STERILE (DRAPES) ×4 IMPLANT
DECANTER SPIKE VIAL GLASS SM (MISCELLANEOUS) ×4 IMPLANT
DRAPE LAPAROTOMY 100X72 PEDS (DRAPES) ×4 IMPLANT
DRAPE LAPAROTOMY T 102X78X121 (DRAPES) ×4 IMPLANT
DRAPE UTILITY XL STRL (DRAPES) ×8 IMPLANT
DRSG PAD ABDOMINAL 8X10 ST (GAUZE/BANDAGES/DRESSINGS) ×4 IMPLANT
ELECT BLADE TIP CTD 4 INCH (ELECTRODE) IMPLANT
ELECT REM PT RETURN 9FT ADLT (ELECTROSURGICAL) ×4
ELECTRODE REM PT RTRN 9FT ADLT (ELECTROSURGICAL) ×2 IMPLANT
GAUZE SPONGE 4X4 12PLY STRL (GAUZE/BANDAGES/DRESSINGS) ×4 IMPLANT
GLOVE BIO SURGEON STRL SZ 6.5 (GLOVE) ×3 IMPLANT
GLOVE BIO SURGEONS STRL SZ 6.5 (GLOVE) ×1
GLOVE BIOGEL M 6.5 STRL (GLOVE) ×8 IMPLANT
GLOVE BIOGEL PI IND STRL 6.5 (GLOVE) ×4 IMPLANT
GLOVE BIOGEL PI IND STRL 7.0 (GLOVE) ×2 IMPLANT
GLOVE BIOGEL PI IND STRL 7.5 (GLOVE) ×4 IMPLANT
GLOVE BIOGEL PI INDICATOR 6.5 (GLOVE) ×4
GLOVE BIOGEL PI INDICATOR 7.0 (GLOVE) ×2
GLOVE BIOGEL PI INDICATOR 7.5 (GLOVE) ×4
GOWN STRL REUS W/TWL XL LVL3 (GOWN DISPOSABLE) ×4 IMPLANT
HYDROGEN PEROXIDE 16OZ (MISCELLANEOUS) IMPLANT
IV CATH 14GX2 1/4 (CATHETERS) IMPLANT
IV CATH 18G SAFETY (IV SOLUTION) ×4 IMPLANT
KIT SIGMOIDOSCOPE (SET/KITS/TRAYS/PACK) IMPLANT
KIT TURNOVER CYSTO (KITS) ×4 IMPLANT
LOOP VESSEL MAXI BLUE (MISCELLANEOUS) IMPLANT
LUBRICANT JELLY K Y 4OZ (MISCELLANEOUS) ×4 IMPLANT
NEEDLE HYPO 22GX1.5 SAFETY (NEEDLE) ×4 IMPLANT
NS IRRIG 500ML POUR BTL (IV SOLUTION) ×4 IMPLANT
PACK BASIN DAY SURGERY FS (CUSTOM PROCEDURE TRAY) ×4 IMPLANT
PAD ARMBOARD 7.5X6 YLW CONV (MISCELLANEOUS) IMPLANT
PENCIL SMOKE EVACUATOR (MISCELLANEOUS) ×4 IMPLANT
SPONGE HEMORRHOID 8X3CM (HEMOSTASIS) IMPLANT
SPONGE SURGIFOAM ABS GEL 12-7 (HEMOSTASIS) IMPLANT
SUCTION FRAZIER HANDLE 10FR (MISCELLANEOUS) ×2
SUCTION TUBE FRAZIER 10FR DISP (MISCELLANEOUS) ×2 IMPLANT
SUT CHROMIC 2 0 SH (SUTURE) IMPLANT
SUT CHROMIC 3 0 SH 27 (SUTURE) IMPLANT
SUT ETHIBOND 0 (SUTURE) IMPLANT
SUT ETHILON 3 0 FSL (SUTURE) ×4 IMPLANT
SUT GUT CHROMIC 3 0 (SUTURE) ×8 IMPLANT
SUT VIC AB 2-0 SH 18 (SUTURE) ×4 IMPLANT
SUT VIC AB 2-0 SH 27 (SUTURE)
SUT VIC AB 2-0 SH 27XBRD (SUTURE) IMPLANT
SUT VIC AB 3-0 SH 18 (SUTURE) IMPLANT
SUT VIC AB 3-0 SH 27 (SUTURE)
SUT VIC AB 3-0 SH 27XBRD (SUTURE) IMPLANT
SYR 10ML LL (SYRINGE) ×4 IMPLANT
SYR 20ML LL LF (SYRINGE) ×4 IMPLANT
SYR BULB IRRIG 60ML STRL (SYRINGE) ×4 IMPLANT
SYR CONTROL 10ML LL (SYRINGE) ×4 IMPLANT
TOWEL OR 17X26 10 PK STRL BLUE (TOWEL DISPOSABLE) ×8 IMPLANT
TRAY DSU PREP LF (CUSTOM PROCEDURE TRAY) ×4 IMPLANT
TUBE CONNECTING 12'X1/4 (SUCTIONS) ×1
TUBE CONNECTING 12X1/4 (SUCTIONS) ×3 IMPLANT
YANKAUER SUCT BULB TIP NO VENT (SUCTIONS) ×4 IMPLANT

## 2020-04-03 NOTE — Anesthesia Preprocedure Evaluation (Signed)
Anesthesia Evaluation  Patient identified by MRN, date of birth, ID band Patient awake    Reviewed: Allergy & Precautions, NPO status , Patient's Chart, lab work & pertinent test results  Airway Mallampati: II  TM Distance: >3 FB Neck ROM: Full    Dental no notable dental hx.    Pulmonary neg pulmonary ROS,    Pulmonary exam normal breath sounds clear to auscultation       Cardiovascular hypertension, Normal cardiovascular exam Rhythm:Regular Rate:Normal     Neuro/Psych Seizures -, Well Controlled,  negative psych ROS   GI/Hepatic negative GI ROS, Neg liver ROS,   Endo/Other  diabetes  Renal/GU negative Renal ROS  negative genitourinary   Musculoskeletal negative musculoskeletal ROS (+)   Abdominal   Peds negative pediatric ROS (+)  Hematology negative hematology ROS (+)   Anesthesia Other Findings   Reproductive/Obstetrics negative OB ROS                             Anesthesia Physical Anesthesia Plan  ASA: III  Anesthesia Plan: MAC   Post-op Pain Management:    Induction: Intravenous  PONV Risk Score and Plan: 1 and Ondansetron  Airway Management Planned: Simple Face Mask  Additional Equipment:   Intra-op Plan:   Post-operative Plan:   Informed Consent: I have reviewed the patients History and Physical, chart, labs and discussed the procedure including the risks, benefits and alternatives for the proposed anesthesia with the patient or authorized representative who has indicated his/her understanding and acceptance.     Dental advisory given  Plan Discussed with: CRNA and Surgeon  Anesthesia Plan Comments:         Anesthesia Quick Evaluation

## 2020-04-03 NOTE — Transfer of Care (Signed)
Immediate Anesthesia Transfer of Care Note  Patient: Daniel Hansen  Procedure(s) Performed: Procedure(s) (LRB): ANAL EXAM UNDER ANESTHESIA, incision and drainage of perianal abcess, drain placement (N/A)  Patient Location: PACU  Anesthesia Type: General  Level of Consciousness: awake, sedated, patient cooperative and responds to stimulation  Airway & Oxygen Therapy: Patient Spontanous Breathing and Patient on RA and soft FM   Post-op Assessment: Report given to PACU RN, Post -op Vital signs reviewed and stable and Patient moving all extremities  Post vital signs: Reviewed and stable  Complications: No apparent anesthesia complications

## 2020-04-03 NOTE — Op Note (Signed)
04/03/2020  8:56 AM  PATIENT:  Daniel Hansen  69 y.o. male  Patient Care Team: Idelle Crouch, MD as PCP - General (Internal Medicine)  PRE-OPERATIVE DIAGNOSIS:  RECURRENT ANAL FISTULA  POST-OPERATIVE DIAGNOSIS:  CHRONIC LARGE PERIRECTAL ABSCESS  PROCEDURE:   ANAL EXAM UNDER ANESTHESIA, incision and drainage of perianal abcess, Malecot drain placement    Surgeon(s): Leighton Ruff, MD  ASSISTANT: none   ANESTHESIA:   local and MAC  SPECIMEN:  No Specimen  DISPOSITION OF SPECIMEN:  N/A  COUNTS:  YES  PLAN OF CARE: Discharge to home after PACU  PATIENT DISPOSITION:  PACU - hemodynamically stable.  INDICATION: 69 year old male with a chronically indwelling seton catheter for many years.  He presented to the office for evaluation and was taken to the operating room approximately 3 months ago for lift procedure.  He continued to have purulent drainage from his wound and therefore I recommended exam under anesthesia in the operating room and possible mucosal advancement flap.   OR FINDINGS: No anal rectal fistula identified.  Patient did have a large chronically draining perirectal abscess extending proximally approximately 8 to 10 cm.  There was no obvious communication with the rectum.  DESCRIPTION: the patient was identified in the preoperative holding area and taken to the OR where they were laid on the operating room table.  MAC anesthesia was induced without difficulty. The patient was then positioned in prone jackknife position with buttocks gently taped apart.  The patient was then prepped and draped in usual sterile fashion.  SCDs were noted to be in place prior to the initiation of anesthesia. A surgical timeout was performed indicating the correct patient, procedure, positioning and need for preoperative antibiotics.  A rectal block was performed using Marcaine with epinephrine mixed with Exparel.    I began with a digital rectal exam.  Rectal tone was somewhat  decreased.  There were no masses noted.  I then placed a Hill-Ferguson anoscope into the anal canal and evaluated this completely.  His anal canal appeared normal.  There was a draining sinus noted in the left posterior lateral perirectal area.  A fistula probe was inserted into this and entered into a large posterior midline perirectal cavity.  I opened this up using electrocautery and continue to divide the cavity proximally as far as I could.  I was able to dilate this enough to get my finger to the end of the cavity which was approximately 8 to 10 cm into the posterior perirectal space.  Given the depth of this cavity, I decided to place a Malecot tube at the proximal portion of the cavity and brought this out through posterior midline at the level of the coccyx.  This was secured into place with 2, 2-0 nylon sutures.  I then marsupialized the edges of the fistula tract using interrupted 3-0 Vicryl sutures.  The tract was then cauterized using electrocautery.  Hemostasis was good.  Lidocaine ointment and sterile dressing were applied.  The patient was then awakened from anesthesia and sent to the postanesthesia care unit in stable condition.

## 2020-04-03 NOTE — Anesthesia Procedure Notes (Signed)
Procedure Name: MAC Date/Time: 04/03/2020 7:30 AM Performed by: Justice Rocher, CRNA Pre-anesthesia Checklist: Patient identified, Emergency Drugs available, Suction available, Patient being monitored and Timeout performed Patient Re-evaluated:Patient Re-evaluated prior to induction Oxygen Delivery Method: Simple face mask Preoxygenation: Pre-oxygenation with 100% oxygen Induction Type: IV induction Placement Confirmation: positive ETCO2,  breath sounds checked- equal and bilateral and CO2 detector

## 2020-04-03 NOTE — Interval H&P Note (Signed)
History and Physical Interval Note:  04/03/2020 7:21 AM  Daniel Hansen  has presented today for surgery, with the diagnosis of RECURRENT ANAL FISTULA.  The various methods of treatment have been discussed with the patient and family. After consideration of risks, benefits and other options for treatment, the patient has consented to  Procedure(s): ANAL EXAM UNDER ANESTHESIA (N/A) POSSIBLE MUCOSAL ADVANCEMENT FLAP (N/A) as a surgical intervention.  The patient's history has been reviewed, patient examined, no change in status, stable for surgery.  I have reviewed the patient's chart and labs.  Questions were answered to the patient's satisfaction.     Rosario Adie, MD  [facility]

## 2020-04-03 NOTE — Discharge Instructions (Addendum)
ANORECTAL SURGERY: POST OP INSTRUCTIONS 1. Take your usually prescribed home medications unless otherwise directed. 2. DIET: During the first few hours after surgery sip on some liquids until you are able to urinate.  It is normal to not urinate for several hours after this surgery.  If you feel uncomfortable, please contact the office for instructions.  After you are able to urinate,you may eat, if you feel like it.  Follow a light bland diet the first 24 hours after arrival home, such as soup, liquids, crackers, etc.  Be sure to include lots of fluids daily (6-8 glasses).  Avoid fast food or heavy meals, as your are more likely to get nauseated.  Eat a low fat diet the next few days after surgery.  Limit caffeine intake to 1-2 servings a day. 3. PAIN CONTROL: a. Pain is best controlled by a usual combination of several different methods TOGETHER: i. Muscle relaxation: Soak in a warm bath (or Sitz bath) three times a day and after bowel movements.  Continue to do this until all pain is resolved. ii. Over the counter pain medication iii. Prescription pain medication b. Most patients will experience some swelling and discomfort in the anus/rectal area and incisions.  Heat such as warm towels, sitz baths, warm baths, etc to help relax tight/sore spots and speed recovery.  Some people prefer to use ice, especially in the first couple days after surgery, as it may decrease the pain and swelling, or alternate between ice & heat.  Experiment to what works for you.  Swelling and bruising can take several weeks to resolve.  Pain can take even longer to completely resolve. c. It is helpful to take an over-the-counter pain medication regularly for the first few weeks.  Choose one of the following that works best for you: i. Naproxen (Aleve, etc)  Two 220mg tabs twice a day ii. Ibuprofen (Advil, etc) Three 200mg tabs four times a day (every meal & bedtime) d. A  prescription for pain medication (such as percocet,  oxycodone, hydrocodone, etc) should be given to you upon discharge.  Take your pain medication as prescribed.  i. If you are having problems/concerns with the prescription medicine (does not control pain, nausea, vomiting, rash, itching, etc), please call us [phone] to see if we need to switch you to a different pain medicine that will work better for you and/or control your side effect better. ii. If you need a refill on your pain medication, please contact your pharmacy.  They will contact our office to request authorization. Prescriptions will not be filled after 5 pm or on week-ends. 4. KEEP YOUR BOWELS REGULAR and AVOID CONSTIPATION a. The goal is one to two soft bowel movements a day.  You should at least have a bowel movement every other day. b. Avoid getting constipated.  Between the surgery and the pain medications, it is common to experience some constipation. This can be very painful after rectal surgery.  Increasing fluid intake and taking a fiber supplement (such as Metamucil, Citrucel, FiberCon, etc) 1-2 times a day regularly will usually help prevent this problem from occurring.  A stool softener like colace is also recommended.  This can be purchased over the counter at your pharmacy.  You can take it up to 3 times a day.  If you do not have a bowel movement after 24 hrs since your surgery, take one does of milk of magnesia.  If you still haven't had a bowel movement 8-12 hours after   that dose, take another dose.  If you don't have a bowel movement 48 hrs after surgery, purchase a Fleets enema from the drug store and administer gently per package instructions.  If you still are having trouble with your bowel movements after that, please call the office for further instructions. c. If you develop diarrhea or have many loose bowel movements, simplify your diet to bland foods & liquids for a few days.  Stop any stool softeners and decrease your fiber supplement.  Switching to mild  anti-diarrheal medications (Kayopectate, Pepto Bismol) can help.  If this worsens or does not improve, please call us.  5. Wound Care a. Remove your bandages before your first bowel movement or 8 hours after surgery.     b. Remove any wound packing material at this tim,e as well.  You do not need to repack the wound unless instructed otherwise.  Wear an absorbent pad or soft cotton gauze in your underwear to catch any drainage and help keep the area clean. You should change this every 2-3 hours while awake. c. Keep the area clean and dry.  Bathe / shower every day, especially after bowel movements.  Keep the area clean by showering / bathing over the incision / wound.   It is okay to soak an open wound to help wash it.  Wet wipes or showers / gentle washing after bowel movements is often less traumatic than regular toilet paper. d. Keep gauze around your drain to collect drainage.  e. Dennis Bast may notice bleeding with bowel movements.  This should slow down by the end of the first week of surgery f. Expect some drainage.   Wear an absorbent pad or soft cotton gauze in your underwear until the drainage stops. g. Do Not sit on a rubber or pillow ring.  This can make you symptoms worse.  You may sit on a soft pillow if needed.  6. ACTIVITIES as tolerated:   a. You may resume regular (light) daily activities beginning the next day--such as daily self-care, walking, climbing stairs--gradually increasing activities as tolerated.  If you can walk 30 minutes without difficulty, it is safe to try more intense activity such as jogging, treadmill, bicycling, low-impact aerobics, swimming, etc. b. Save the most intensive and strenuous activity for last such as sit-ups, heavy lifting, contact sports, etc  Refrain from any heavy lifting or straining until you are off narcotics for pain control.   c. You may drive when you are no longer taking prescription pain medication, you can comfortably sit for long periods of time,  and you can safely maneuver your car and apply brakes. d. Dennis Bast may have sexual intercourse when it is comfortable.  7. FOLLOW UP in our office a. Please call [facility] at [phone] to set up an appointment to see your surgeon in the office for a follow-up appointment approximately 3-4 weeks after your surgery. b. Make sure that you call for this appointment the day you arrive home to insure a convenient appointment time. 10. IF YOU HAVE DISABILITY OR FAMILY LEAVE FORMS, BRING THEM TO THE OFFICE FOR PROCESSING.  DO NOT GIVE THEM TO YOUR DOCTOR.   WHEN TO CALL us [phone]: 1. Poor pain control 2. Reactions / problems with new medications (rash/itching, nausea, etc)  3. Fever over 101.5 F (38.5 C) 4. Inability to urinate 5. Nausea and/or vomiting 6. Worsening swelling or bruising 7. Continued bleeding from incision. 8. Increased pain, redness, or drainage from the incision  The  clinic staff is available to answer your questions during regular business hours (8:30am-5pm).  Please don't hesitate to call and ask to speak to one of our nurses for clinical concerns.   A surgeon from [facility] is always on call at the hospitals   If you have a medical emergency, go to the nearest emergency room or call 911.    [facility], [address], [place], [place], [place]  [address] ? MAIN: [phone] ? TOLL FREE: [phone] ? FAX [phone] [email or web address]   Post Anesthesia Home Care Instructions  Activity: Get plenty of rest for the remainder of the day. A responsible individual must stay with you for 24 hours following the procedure.  For the next 24 hours, DO NOT: -Drive a car -Paediatric nurse -Drink alcoholic beverages -Take any medication unless instructed by your physician -Make any legal decisions or sign important papers.  Meals: Start with liquid foods such as gelatin or soup. Progress to regular foods as  tolerated. Avoid greasy, spicy, heavy foods. If nausea and/or vomiting occur, drink only clear liquids until the nausea and/or vomiting subsides. Call your physician if vomiting continues.  Special Instructions/Symptoms: Your throat may feel dry or sore from the anesthesia or the breathing tube placed in your throat during surgery. If this causes discomfort, gargle with warm salt water. The discomfort should disappear within 24 hours.  If you had a scopolamine patch placed behind your ear for the management of post- operative nausea and/or vomiting:  1. The medication in the patch is effective for 72 hours, after which it should be removed.  Wrap patch in a tissue and discard in the trash. Wash hands thoroughly with soap and water. 2. You may remove the patch earlier than 72 hours if you experience unpleasant side effects which may include dry mouth, dizziness or visual disturbances. 3. Avoid touching the patch. Wash your hands with soap and water after contact with the patch.     Information for Discharge Teaching: EXPAREL (bupivacaine liposome injectable suspension)   Your surgeon or anesthesiologist gave you EXPAREL(bupivacaine) to help control your pain after surgery.   EXPAREL is a local anesthetic that provides pain relief by numbing the tissue around the surgical site.  EXPAREL is designed to release pain medication over time and can control pain for up to 72 hours.  Depending on how you respond to EXPAREL, you may require less pain medication during your recovery.  Possible side effects:  Temporary loss of sensation or ability to move in the area where bupivacaine was injected.  Nausea, vomiting, constipation  Rarely, numbness and tingling in your mouth or lips, lightheadedness, or anxiety may occur.  Call your doctor right away if you think you may be experiencing any of these sensations, or if you have other questions regarding possible side effects.  Follow all other  discharge instructions given to you by your surgeon or nurse. Eat a healthy diet and drink plenty of water or other fluids.  If you return to the hospital for any reason within 96 hours following the administration of EXPAREL, it is important for health care providers to know that you have received this anesthetic. A teal colored band has been placed on your arm with the date, time and amount of EXPAREL you have received in order to alert and inform your health care providers. Please leave this armband in place for the full 96 hours following administration, and then you may remove the band.

## 2020-04-03 NOTE — Anesthesia Postprocedure Evaluation (Signed)
Anesthesia Post Note  Patient: Daniel Hansen  Procedure(s) Performed: ANAL EXAM UNDER ANESTHESIA, incision and drainage of perianal abcess, drain placement (N/A Rectum)     Patient location during evaluation: PACU Anesthesia Type: MAC Level of consciousness: awake and alert Pain management: pain level controlled Vital Signs Assessment: post-procedure vital signs reviewed and stable Respiratory status: spontaneous breathing, nonlabored ventilation, respiratory function stable and patient connected to nasal cannula oxygen Cardiovascular status: stable and blood pressure returned to baseline Postop Assessment: no apparent nausea or vomiting Anesthetic complications: no   No complications documented.  Last Vitals:  Vitals:   04/03/20 0830 04/03/20 0845  BP: (!) 97/56 102/61  Pulse: (!) 52 (!) 54  Resp: (!) 9 (!) 8  Temp:    SpO2: 90% 95%    Last Pain:  Vitals:   04/03/20 0900  TempSrc:   PainSc: 0-No pain                 Brailynn Breth S

## 2020-04-04 ENCOUNTER — Encounter (HOSPITAL_BASED_OUTPATIENT_CLINIC_OR_DEPARTMENT_OTHER): Payer: Self-pay | Admitting: General Surgery

## 2020-04-22 DIAGNOSIS — M5432 Sciatica, left side: Secondary | ICD-10-CM | POA: Diagnosis not present

## 2020-05-30 DIAGNOSIS — Z8669 Personal history of other diseases of the nervous system and sense organs: Secondary | ICD-10-CM | POA: Diagnosis not present

## 2020-05-30 DIAGNOSIS — G8929 Other chronic pain: Secondary | ICD-10-CM | POA: Diagnosis not present

## 2020-05-30 DIAGNOSIS — Z79899 Other long term (current) drug therapy: Secondary | ICD-10-CM | POA: Diagnosis not present

## 2020-05-30 DIAGNOSIS — I10 Essential (primary) hypertension: Secondary | ICD-10-CM | POA: Diagnosis not present

## 2020-05-30 DIAGNOSIS — M25552 Pain in left hip: Secondary | ICD-10-CM | POA: Diagnosis not present

## 2020-05-30 DIAGNOSIS — E7800 Pure hypercholesterolemia, unspecified: Secondary | ICD-10-CM | POA: Diagnosis not present

## 2020-05-30 DIAGNOSIS — R7309 Other abnormal glucose: Secondary | ICD-10-CM | POA: Diagnosis not present

## 2020-05-30 DIAGNOSIS — G40909 Epilepsy, unspecified, not intractable, without status epilepticus: Secondary | ICD-10-CM | POA: Diagnosis not present

## 2020-07-28 DIAGNOSIS — K603 Anal fistula: Secondary | ICD-10-CM | POA: Diagnosis not present

## 2020-07-31 DIAGNOSIS — G8929 Other chronic pain: Secondary | ICD-10-CM | POA: Diagnosis not present

## 2020-07-31 DIAGNOSIS — E119 Type 2 diabetes mellitus without complications: Secondary | ICD-10-CM | POA: Diagnosis not present

## 2020-07-31 DIAGNOSIS — M5416 Radiculopathy, lumbar region: Secondary | ICD-10-CM | POA: Diagnosis not present

## 2020-07-31 DIAGNOSIS — M25552 Pain in left hip: Secondary | ICD-10-CM | POA: Diagnosis not present

## 2020-08-05 DIAGNOSIS — S82009A Unspecified fracture of unspecified patella, initial encounter for closed fracture: Secondary | ICD-10-CM | POA: Insufficient documentation

## 2020-08-05 DIAGNOSIS — S42209A Unspecified fracture of upper end of unspecified humerus, initial encounter for closed fracture: Secondary | ICD-10-CM | POA: Insufficient documentation

## 2020-12-08 ENCOUNTER — Ambulatory Visit: Payer: Medicare HMO | Admitting: Podiatry

## 2020-12-17 ENCOUNTER — Encounter: Payer: Self-pay | Admitting: Podiatry

## 2020-12-17 ENCOUNTER — Other Ambulatory Visit: Payer: Self-pay

## 2020-12-17 ENCOUNTER — Ambulatory Visit (INDEPENDENT_AMBULATORY_CARE_PROVIDER_SITE_OTHER): Payer: Medicare HMO

## 2020-12-17 ENCOUNTER — Ambulatory Visit: Payer: Medicare HMO | Admitting: Podiatry

## 2020-12-17 DIAGNOSIS — L84 Corns and callosities: Secondary | ICD-10-CM

## 2020-12-17 DIAGNOSIS — M21621 Bunionette of right foot: Secondary | ICD-10-CM

## 2020-12-17 DIAGNOSIS — M216X9 Other acquired deformities of unspecified foot: Secondary | ICD-10-CM | POA: Diagnosis not present

## 2020-12-17 DIAGNOSIS — E119 Type 2 diabetes mellitus without complications: Secondary | ICD-10-CM

## 2020-12-18 ENCOUNTER — Telehealth: Payer: Self-pay

## 2020-12-18 NOTE — Telephone Encounter (Signed)
Urea cream 40% with or without salicylic acid (can get on [place] or they can come by office and buy Revitaderm)

## 2020-12-18 NOTE — Telephone Encounter (Signed)
Pts wife called and would like to know what she could buy or use to help with calluses and dead skin. Please advise.

## 2020-12-18 NOTE — Telephone Encounter (Signed)
Pt wife, Olegario Shearer, will come by the office to ger Revitaderm.

## 2020-12-20 NOTE — Progress Notes (Signed)
  Subjective:  Patient ID: NORMAL RECINOS, male    DOB: 1951-05-14,  MRN: [id number]  Chief Complaint  Patient presents with   Foot Pain    Patient presents today for painful right callus and tailors bunion off and on x years.  He says it hurts with certain shoes    70 y.o. male presents with the above complaint. History confirmed with patient.   Objective:  Physical Exam: warm, good capillary refill, no trophic changes or ulcerative lesions, normal DP and PT pulses, and normal sensory exam.  Right Foot: bunionette deformity noted with overlying hyperkeratosis   Radiographs: Multiple views x-ray of the right foot:  Pes cavus deformity with tailor's bunion Assessment:   1. Tailor's bunion of right foot   2. Callus of foot   3. Acquired pes cavus   4. Type 2 diabetes mellitus without complication, without long-term current use of insulin ([place])      Plan:  Patient was evaluated and treated and all questions answered.  Discussed surgical nonsurgical treatment options for tailor's bunions and calluses.  Recommended debridement of the callus which I did for him today and recommended use of urea cream.  I think surgical correction of the deformity would be difficult for him.  Return as needed.  Return if symptoms worsen or fail to improve.

## 2021-02-25 ENCOUNTER — Other Ambulatory Visit: Payer: Self-pay

## 2021-02-25 ENCOUNTER — Ambulatory Visit (INDEPENDENT_AMBULATORY_CARE_PROVIDER_SITE_OTHER): Payer: Medicare HMO

## 2021-02-25 ENCOUNTER — Encounter (INDEPENDENT_AMBULATORY_CARE_PROVIDER_SITE_OTHER): Payer: Self-pay

## 2021-02-25 DIAGNOSIS — E119 Type 2 diabetes mellitus without complications: Secondary | ICD-10-CM

## 2021-02-25 DIAGNOSIS — M216X9 Other acquired deformities of unspecified foot: Secondary | ICD-10-CM

## 2021-02-25 DIAGNOSIS — M21621 Bunionette of right foot: Secondary | ICD-10-CM

## 2021-02-25 NOTE — Progress Notes (Signed)
Patient presents to the office today for diabetic shoe and insole measuring.  Patient was measured with brannock device to determine size and width for 1 pair of extra depth shoes and foam casted for 3 pair of insoles.   Documentation of medical necessity will be sent to patient's treating diabetic doctor to verify and sign.   Patient's diabetic provider: Dr. Rexford Maus and insoles will be ordered at that time and patient will be notified for an appointment for fitting when they arrive.   Shoe size (per patient):    Brannock measurement:   Patient shoe selection-   1st choice:   V551M  2nd choice:  V821M  Shoe size ordered: 9.5 XW

## 2021-03-03 DIAGNOSIS — Z125 Encounter for screening for malignant neoplasm of prostate: Secondary | ICD-10-CM | POA: Diagnosis not present

## 2021-03-03 DIAGNOSIS — E785 Hyperlipidemia, unspecified: Secondary | ICD-10-CM | POA: Diagnosis not present

## 2021-03-03 DIAGNOSIS — Z0000 Encounter for general adult medical examination without abnormal findings: Secondary | ICD-10-CM | POA: Diagnosis not present

## 2021-03-03 DIAGNOSIS — I10 Essential (primary) hypertension: Secondary | ICD-10-CM | POA: Diagnosis not present

## 2021-03-03 DIAGNOSIS — G40909 Epilepsy, unspecified, not intractable, without status epilepticus: Secondary | ICD-10-CM | POA: Diagnosis not present

## 2021-03-03 DIAGNOSIS — Z1211 Encounter for screening for malignant neoplasm of colon: Secondary | ICD-10-CM | POA: Diagnosis not present

## 2021-03-03 DIAGNOSIS — R739 Hyperglycemia, unspecified: Secondary | ICD-10-CM | POA: Diagnosis not present

## 2021-03-03 DIAGNOSIS — Z79899 Other long term (current) drug therapy: Secondary | ICD-10-CM | POA: Diagnosis not present

## 2021-03-04 DIAGNOSIS — R8290 Unspecified abnormal findings in urine: Secondary | ICD-10-CM | POA: Diagnosis not present

## 2021-03-13 DIAGNOSIS — Z1211 Encounter for screening for malignant neoplasm of colon: Secondary | ICD-10-CM | POA: Diagnosis not present

## 2021-05-18 ENCOUNTER — Ambulatory Visit: Payer: Medicare HMO

## 2021-05-18 ENCOUNTER — Other Ambulatory Visit: Payer: Self-pay

## 2021-05-18 ENCOUNTER — Encounter (INDEPENDENT_AMBULATORY_CARE_PROVIDER_SITE_OTHER): Payer: Self-pay

## 2021-05-18 DIAGNOSIS — M216X2 Other acquired deformities of left foot: Secondary | ICD-10-CM | POA: Diagnosis not present

## 2021-05-18 DIAGNOSIS — E119 Type 2 diabetes mellitus without complications: Secondary | ICD-10-CM | POA: Diagnosis not present

## 2021-05-18 DIAGNOSIS — M216X1 Other acquired deformities of right foot: Secondary | ICD-10-CM | POA: Diagnosis not present

## 2021-05-18 NOTE — Progress Notes (Signed)
SITUATION Reason for Visit: Fitting of Diabetic Shoes & Insoles Patient / Caregiver Report:  Patient reports satisfaction  OBJECTIVE DATA: Patient History / Diagnosis:  Type 2 diabetes mellitus without complication, without long-term current use of insulin ([place])  Change in Status:   None  ACTIONS PERFORMED: In-Person Delivery, patient was fit with: - 1x pair A5500 PDAC approved prefabricated Diabetic Shoes: V551M Sealth [employer]ner 9.5W - 3x pair [id number] PDAC approved CAM milled custom diabetic insoles  Shoes and insoles were verified for structural integrity and safety. Patient wore shoes and insoles in office. Skin was inspected and free of areas of concern after wearing shoes and inserts. Shoes and inserts fit properly. Patient / Caregiver provided with ferbal instruction and demonstration regarding donning, doffing, wear, care, proper fit, function, purpose, cleaning, and use of shoes and insoles ' and in all related precautions and risks and benefits regarding shoes and insoles. Patient / Caregiver was instructed to wear properly fitting socks with shoes at all times. Patient was also provided with verbal instruction regarding how to report any failures or malfunctions of shoes or inserts, and necessary follow up care. Patient / Caregiver was also instructed to contact physician regarding change in status that may affect function of shoes and inserts.   Patient / Caregiver verbalized undersatnding of instruction provided. Patient / Caregiver demonstrated independence with proper donning and doffing of shoes and inserts.  PLAN Patient to follow up as needed. Plan of care was discussed with and agreed upon by patient and/or caregiver. All questions were answered and concerns addressed.

## 2021-06-17 DIAGNOSIS — R8290 Unspecified abnormal findings in urine: Secondary | ICD-10-CM | POA: Diagnosis not present

## 2021-06-17 DIAGNOSIS — R739 Hyperglycemia, unspecified: Secondary | ICD-10-CM | POA: Diagnosis not present

## 2021-06-17 DIAGNOSIS — I10 Essential (primary) hypertension: Secondary | ICD-10-CM | POA: Diagnosis not present

## 2021-06-17 DIAGNOSIS — E785 Hyperlipidemia, unspecified: Secondary | ICD-10-CM | POA: Diagnosis not present

## 2021-06-17 DIAGNOSIS — G40909 Epilepsy, unspecified, not intractable, without status epilepticus: Secondary | ICD-10-CM | POA: Diagnosis not present

## 2021-06-17 DIAGNOSIS — E079 Disorder of thyroid, unspecified: Secondary | ICD-10-CM | POA: Diagnosis not present

## 2021-06-17 DIAGNOSIS — R569 Unspecified convulsions: Secondary | ICD-10-CM | POA: Diagnosis not present

## 2021-09-21 DIAGNOSIS — E782 Mixed hyperlipidemia: Secondary | ICD-10-CM | POA: Diagnosis not present

## 2021-09-21 DIAGNOSIS — L989 Disorder of the skin and subcutaneous tissue, unspecified: Secondary | ICD-10-CM | POA: Diagnosis not present

## 2021-09-21 DIAGNOSIS — Z79899 Other long term (current) drug therapy: Secondary | ICD-10-CM | POA: Diagnosis not present

## 2021-09-21 DIAGNOSIS — Z933 Colostomy status: Secondary | ICD-10-CM | POA: Diagnosis not present

## 2021-09-21 DIAGNOSIS — I10 Essential (primary) hypertension: Secondary | ICD-10-CM | POA: Diagnosis not present

## 2021-09-21 DIAGNOSIS — M545 Low back pain, unspecified: Secondary | ICD-10-CM | POA: Diagnosis not present

## 2021-09-21 DIAGNOSIS — R7309 Other abnormal glucose: Secondary | ICD-10-CM | POA: Diagnosis not present

## 2021-09-21 DIAGNOSIS — E039 Hypothyroidism, unspecified: Secondary | ICD-10-CM | POA: Diagnosis not present

## 2021-09-21 DIAGNOSIS — G40909 Epilepsy, unspecified, not intractable, without status epilepticus: Secondary | ICD-10-CM | POA: Diagnosis not present

## 2021-10-05 DIAGNOSIS — M5432 Sciatica, left side: Secondary | ICD-10-CM | POA: Diagnosis not present

## 2021-10-05 DIAGNOSIS — E119 Type 2 diabetes mellitus without complications: Secondary | ICD-10-CM | POA: Diagnosis not present

## 2021-10-05 DIAGNOSIS — G5602 Carpal tunnel syndrome, left upper limb: Secondary | ICD-10-CM | POA: Diagnosis not present

## 2021-11-06 ENCOUNTER — Telehealth: Payer: Self-pay

## 2021-11-06 NOTE — Telephone Encounter (Signed)
Received phonecall regarding question about eligibility for new diabetic shoes and insoles. Informed patient that Medicare will cover one pair of shoes and three pair of inserts every calendar year. He received his last set on 05/18/2021 and is eligible for evaluation now. Recommended patient see his podiatrist if he has not within the last year to be scheduled for a diabetic foot exam. Patient understood instructions and will call to schedule at his convenience. All questions answered and concerns addressed.

## 2021-12-24 DIAGNOSIS — R202 Paresthesia of skin: Secondary | ICD-10-CM | POA: Diagnosis not present

## 2021-12-24 DIAGNOSIS — M25562 Pain in left knee: Secondary | ICD-10-CM | POA: Diagnosis not present

## 2021-12-24 DIAGNOSIS — E119 Type 2 diabetes mellitus without complications: Secondary | ICD-10-CM | POA: Diagnosis not present

## 2021-12-24 DIAGNOSIS — M5416 Radiculopathy, lumbar region: Secondary | ICD-10-CM | POA: Diagnosis not present

## 2021-12-24 DIAGNOSIS — M47814 Spondylosis without myelopathy or radiculopathy, thoracic region: Secondary | ICD-10-CM | POA: Diagnosis not present

## 2022-02-01 DIAGNOSIS — I10 Essential (primary) hypertension: Secondary | ICD-10-CM | POA: Diagnosis not present

## 2022-02-01 DIAGNOSIS — M2550 Pain in unspecified joint: Secondary | ICD-10-CM | POA: Diagnosis not present

## 2022-02-01 DIAGNOSIS — E039 Hypothyroidism, unspecified: Secondary | ICD-10-CM | POA: Diagnosis not present

## 2022-02-01 DIAGNOSIS — E782 Mixed hyperlipidemia: Secondary | ICD-10-CM | POA: Diagnosis not present

## 2022-02-01 DIAGNOSIS — Z79899 Other long term (current) drug therapy: Secondary | ICD-10-CM | POA: Diagnosis not present

## 2022-02-01 DIAGNOSIS — R739 Hyperglycemia, unspecified: Secondary | ICD-10-CM | POA: Diagnosis not present

## 2022-02-01 DIAGNOSIS — G40909 Epilepsy, unspecified, not intractable, without status epilepticus: Secondary | ICD-10-CM | POA: Diagnosis not present

## 2022-02-12 DIAGNOSIS — G5602 Carpal tunnel syndrome, left upper limb: Secondary | ICD-10-CM | POA: Diagnosis not present

## 2022-02-12 DIAGNOSIS — G5601 Carpal tunnel syndrome, right upper limb: Secondary | ICD-10-CM | POA: Diagnosis not present

## 2022-03-02 DIAGNOSIS — M1712 Unilateral primary osteoarthritis, left knee: Secondary | ICD-10-CM | POA: Diagnosis not present

## 2022-03-18 ENCOUNTER — Encounter: Payer: Self-pay | Admitting: Dermatology

## 2022-03-18 ENCOUNTER — Ambulatory Visit: Payer: Medicare HMO | Admitting: Dermatology

## 2022-03-18 DIAGNOSIS — C4359 Malignant melanoma of other part of trunk: Secondary | ICD-10-CM | POA: Diagnosis not present

## 2022-03-18 DIAGNOSIS — D492 Neoplasm of unspecified behavior of bone, soft tissue, and skin: Secondary | ICD-10-CM

## 2022-03-18 DIAGNOSIS — L821 Other seborrheic keratosis: Secondary | ICD-10-CM | POA: Diagnosis not present

## 2022-03-18 DIAGNOSIS — L820 Inflamed seborrheic keratosis: Secondary | ICD-10-CM | POA: Diagnosis not present

## 2022-03-18 DIAGNOSIS — L578 Other skin changes due to chronic exposure to nonionizing radiation: Secondary | ICD-10-CM

## 2022-03-18 DIAGNOSIS — Z85820 Personal history of malignant melanoma of skin: Secondary | ICD-10-CM | POA: Diagnosis not present

## 2022-03-18 DIAGNOSIS — C439 Malignant melanoma of skin, unspecified: Secondary | ICD-10-CM

## 2022-03-18 DIAGNOSIS — L570 Actinic keratosis: Secondary | ICD-10-CM

## 2022-03-18 HISTORY — DX: Malignant melanoma of skin, unspecified: C43.9

## 2022-03-18 NOTE — Progress Notes (Signed)
New Patient Visit  Subjective  Daniel Hansen is a 71 y.o. male who presents for the following: check spots (Scalp, > 1 yr, tender to touch/Back, 68m growing/) and hx of skin cancer (Pt not sure what kind or where they were, txted yrs ago by Dr. HKoleen Nimrod. The patient has spots, moles and lesions to be evaluated, some may be new or changing and the patient has concerns that these could be cancer.  Patient accompanied by wife.   New patient referral from Dr. JFulton Reek  The following portions of the chart were reviewed this encounter and updated as appropriate:   Tobacco  Allergies  Meds  Problems  Med Hx  Surg Hx  Fam Hx     Review of Systems:  No other skin or systemic complaints except as noted in HPI or Assessment and Plan.  Objective  Well appearing patient in no apparent distress; mood and affect are within normal limits.  A focused examination was performed including scalp, face, back. Relevant physical exam findings are noted in the Assessment and Plan.  Scalp, L ear x 6 (6) Stuck on waxy paps with erythema  R back infrascapula Brown hyperkeratotic pap 2.5cm     Scalp 12 (12) Pink scaly macules   Assessment & Plan  Inflamed seborrheic keratosis (6) Scalp, L ear x 6 Symptomatic, irritating, patient would like treated. Destruction of lesion - Scalp, L ear x 6 Complexity: simple   Destruction method: cryotherapy   Informed consent: discussed and consent obtained   Timeout:  patient name, date of birth, surgical site, and procedure verified Lesion destroyed using liquid nitrogen: Yes   Region frozen until ice ball extended beyond lesion: Yes   Outcome: patient tolerated procedure well with no complications   Post-procedure details: wound care instructions given    Neoplasm of skin R back infrascapula Epidermal / dermal shaving  Lesion diameter (cm):  2.5 Informed consent: discussed and consent obtained   Timeout: patient name, date of birth,  surgical site, and procedure verified   Procedure prep:  Patient was prepped and draped in usual sterile fashion Prep type:  Isopropyl alcohol Anesthesia: the lesion was anesthetized in a standard fashion   Anesthetic:  1% lidocaine w/ epinephrine 1-100,000 buffered w/ 8.4% NaHCO3 Instrument used: flexible razor blade   Hemostasis achieved with: pressure, aluminum chloride and electrodesiccation   Outcome: patient tolerated procedure well   Post-procedure details: sterile dressing applied and wound care instructions given   Dressing type: bandage and petrolatum    Specimen 1 - Surgical pathology Differential Diagnosis: D48.5 ISK r/o Skin Cancer/Melanoma  Check Margins: yes Brown hyperkeratotic pap ~2.5cm  Hypertrophic actinic keratosis (12) Scalp 12 Destruction of lesion - Scalp 12 Complexity: simple   Destruction method: cryotherapy   Informed consent: discussed and consent obtained   Timeout:  patient name, date of birth, surgical site, and procedure verified Lesion destroyed using liquid nitrogen: Yes   Region frozen until ice ball extended beyond lesion: Yes   Outcome: patient tolerated procedure well with no complications   Post-procedure details: wound care instructions given    History of Melanoma - No evidence of recurrence today - No lymphadenopathy - Recommend regular full body skin exams - Recommend daily broad spectrum sunscreen SPF 30+ to sun-exposed areas, reapply every 2 hours as needed.  - Call if any new or changing lesions are noted between office visits  - back x 2  Actinic Damage - chronic, secondary to cumulative UV radiation exposure/sun  exposure over time - diffuse scaly erythematous macules with underlying dyspigmentation - Recommend daily broad spectrum sunscreen SPF 30+ to sun-exposed areas, reapply every 2 hours as needed.  - Recommend staying in the shade or wearing long sleeves, sun glasses (UVA+UVB protection) and wide brim hats (4-inch brim  around the entire circumference of the hat). - Call for new or changing lesions.  Seborrheic Keratoses - Stuck-on, waxy, tan-brown papules and/or plaques  - Benign-appearing - Discussed benign etiology and prognosis. - Observe - Call for any changes  Return in about 6 weeks (around 04/29/2022) for bx f/u, AK f/u.  I, Othelia Pulling, RMA, am acting as scribe for Sarina Ser, MD . Documentation: I have reviewed the above documentation for accuracy and completeness, and I agree with the above.  Sarina Ser, MD

## 2022-03-18 NOTE — Patient Instructions (Addendum)
Cryotherapy Aftercare  Wash gently with soap and water everyday.   Apply Vaseline and Band-Aid daily until healed.  Wound Care Instructions  Cleanse wound gently with soap and water once a day then pat dry with clean gauze. Apply a thin coat of Petrolatum (petroleum jelly, "Vaseline") over the wound (unless you have an allergy to this). We recommend that you use a new, sterile tube of Vaseline. Do not pick or remove scabs. Do not remove the yellow or white "healing tissue" from the base of the wound.  Cover the wound with fresh, clean, nonstick gauze and secure with paper tape. You may use Band-Aids in place of gauze and tape if the wound is small enough, but would recommend trimming much of the tape off as there is often too much. Sometimes Band-Aids can irritate the skin.  You should call the office for your biopsy report after 1 week if you have not already been contacted.  If you experience any problems, such as abnormal amounts of bleeding, swelling, significant bruising, significant pain, or evidence of infection, please call the office immediately.  FOR ADULT SURGERY PATIENTS: If you need something for pain relief you may take 1 extra strength Tylenol (acetaminophen) AND 2 Ibuprofen (200mg each) together every 4 hours as needed for pain. (do not take these if you are allergic to them or if you have a reason you should not take them.) Typically, you may only need pain medication for 1 to 3 days.      Due to recent changes in healthcare laws, you may see results of your pathology and/or laboratory studies on MyChart before the doctors have had a chance to review them. We understand that in some cases there may be results that are confusing or concerning to you. Please understand that not all results are received at the same time and often the doctors may need to interpret multiple results in order to provide you with the best plan of care or course of treatment. Therefore, we ask that you  please give us 2 business days to thoroughly review all your results before contacting the office for clarification. Should we see a critical lab result, you will be contacted sooner.   If You Need Anything After Your Visit  If you have any questions or concerns for your doctor, please call our main line at [phone] and press option 4 to reach your doctor's medical assistant. If no one answers, please leave a voicemail as directed and we will return your call as soon as possible. Messages left after 4 pm will be answered the following business day.   You may also send us a message via MyChart. We typically respond to MyChart messages within 1-2 business days.  For prescription refills, please ask your pharmacy to contact our office. Our fax number is [phone].  If you have an urgent issue when the clinic is closed that cannot wait until the next business day, you can page your doctor at the number below.    Please note that while we do our best to be available for urgent issues outside of office hours, we are not available 24/7.   If you have an urgent issue and are unable to reach us, you may choose to seek medical care at your doctor's office, retail clinic, urgent care center, or emergency room.  If you have a medical emergency, please immediately call 911 or go to the emergency department.  Pager Numbers  - Dr. Kowalski: [phone]  -   Dr. Moye: [phone]  - Dr. Stewart: [phone]  In the event of inclement weather, please call our main line at [phone] for an update on the status of any delays or closures.  Dermatology Medication Tips: Please keep the boxes that topical medications come in in order to help keep track of the instructions about where and how to use these. Pharmacies typically print the medication instructions only on the boxes and not directly on the medication tubes.   If your medication is too expensive, please contact our office at  [phone] option 4 or send us a message through MyChart.   We are unable to tell what your co-pay for medications will be in advance as this is different depending on your insurance coverage. However, we may be able to find a substitute medication at lower cost or fill out paperwork to get insurance to cover a needed medication.   If a prior authorization is required to get your medication covered by your insurance company, please allow us 1-2 business days to complete this process.  Drug prices often vary depending on where the prescription is filled and some pharmacies may offer cheaper prices.  The website www.goodrx.com contains coupons for medications through different pharmacies. The prices here do not account for what the cost may be with help from insurance (it may be cheaper with your insurance), but the website can give you the price if you did not use any insurance.  - You can print the associated coupon and take it with your prescription to the pharmacy.  - You may also stop by our office during regular business hours and pick up a GoodRx coupon card.  - If you need your prescription sent electronically to a different pharmacy, notify our office through [facility] MyChart or by phone at [phone] option 4.     Si Usted Necesita Algo Despus de Su Visita  Tambin puede enviarnos un mensaje a travs de MyChart. Por lo general respondemos a los mensajes de MyChart en el transcurso de 1 a 2 das hbiles.  Para renovar recetas, por favor pida a su farmacia que se ponga en contacto con nuestra oficina. Nuestro nmero de fax es el [phone].  Si tiene un asunto urgente cuando la clnica est cerrada y que no puede esperar hasta el siguiente da hbil, puede llamar/localizar a su doctor(a) al nmero que aparece a continuacin.   Por favor, tenga en cuenta que aunque hacemos todo lo posible para estar disponibles para asuntos urgentes fuera del horario de oficina, no estamos  disponibles las 24 horas del da, los 7 das de la semana.   Si tiene un problema urgente y no puede comunicarse con nosotros, puede optar por buscar atencin mdica  en el consultorio de su doctor(a), en una clnica privada, en un centro de atencin urgente o en una sala de emergencias.  Si tiene una emergencia mdica, por favor llame inmediatamente al 911 o vaya a la sala de emergencias.  Nmeros de bper  - Dr. Kowalski: [phone]  - Dra. Moye: [phone]  - Dra. Stewart: [phone]  En caso de inclemencias del tiempo, por favor llame a nuestra lnea principal al [phone] para una actualizacin sobre el estado de cualquier retraso o cierre.  Consejos para la medicacin en dermatologa: Por favor, guarde las cajas en las que vienen los medicamentos de uso tpico para ayudarle a seguir las instrucciones sobre dnde y cmo usarlos. Las farmacias generalmente imprimen las instrucciones del medicamento slo en las cajas y   no directamente en los tubos del medicamento.   Si su medicamento es muy caro, por favor, pngase en contacto con nuestra oficina llamando al [phone] y presione la opcin 4 o envenos un mensaje a travs de MyChart.   No podemos decirle cul ser su copago por los medicamentos por adelantado ya que esto es diferente dependiendo de la cobertura de su seguro. Sin embargo, es posible que podamos encontrar un medicamento sustituto a menor costo o llenar un formulario para que el seguro cubra el medicamento que se considera necesario.   Si se requiere una autorizacin previa para que su compaa de seguros cubra su medicamento, por favor permtanos de 1 a 2 das hbiles para completar este proceso.  Los precios de los medicamentos varan con frecuencia dependiendo del lugar de dnde se surte la receta y alguna farmacias pueden ofrecer precios ms baratos.  El sitio web www.goodrx.com tiene cupones para medicamentos de diferentes farmacias. Los precios aqu no  tienen en cuenta lo que podra costar con la ayuda del seguro (puede ser ms barato con su seguro), pero el sitio web puede darle el precio si no utiliz ningn seguro.  - Puede imprimir el cupn correspondiente y llevarlo con su receta a la farmacia.  - Tambin puede pasar por nuestra oficina durante el horario de atencin regular y recoger una tarjeta de cupones de GoodRx.  - Si necesita que su receta se enve electrnicamente a una farmacia diferente, informe a nuestra oficina a travs de MyChart de [facility] o por telfono llamando al [phone] y presione la opcin 4.  

## 2022-03-23 ENCOUNTER — Encounter: Payer: Self-pay | Admitting: Dermatology

## 2022-03-24 ENCOUNTER — Other Ambulatory Visit: Payer: Self-pay

## 2022-03-24 DIAGNOSIS — C439 Malignant melanoma of skin, unspecified: Secondary | ICD-10-CM

## 2022-03-25 ENCOUNTER — Telehealth: Payer: Self-pay

## 2022-03-25 NOTE — Telephone Encounter (Signed)
-----   Message from Ralene Bathe, MD sent at 03/24/2022  3:09 PM EDT ----- Diagnosis Skin (M), right back infrascapular MALIGNANT MELANOMA MELANOMA TABLE (AJCC 8TH EDITION#) PROCEDURE: NOT SPECIFIED SPECIMEN ANATOMIC SITE: RIGHT BACK INFRASCAPULAR HISTOLOGIC TYPE: BRESLOW'S DEPTH/MAXIMUM TUMOR THICKNESS: 4.4 MM CLARK/ANATOMIC LEVEL: IV MARGINS PERIPHERAL MARGINS: CLOSEST 1.3 MM DEEP MARGIN: FREE ULCERATION: PRESENT SATELLITOSIS: ABSENT MITOTIC INDEX: 11/MM2 LYMPHO-VASCULAR INVASION: ABSENT NEUROTROPISM: ABSENT TUMOR-INFILTRATING LYMPHOCYTES: NON-BRISK TUMOR REGRESSION: EXTENSIVE; COMPLETE REGRESSION IN FOCI LYMPH NODES (IF APPLICABLE): N/A PATHOLOGIC STAGE: PT4B  Cancer - Malignant Melanoma - invasive and severe Needs evaluation at [facility] - Dr Molli Hazard al. Plan to send Consult. Called pt and discussed by phone with pt and wife our recommendations.  Pt and wife do not want to go to [facility] due to a bad experience there.  I advised that we can send to [facility] in [place] (contact Dr Tamala Julian at [facility] for details). Please make pt appt with me at [facility] in next few days for Melanoma Matrix discussion.

## 2022-03-25 NOTE — Telephone Encounter (Signed)
Left message for patient to call office to schedule appointment for Melanoma Matrix/hd

## 2022-03-30 ENCOUNTER — Other Ambulatory Visit: Payer: Self-pay

## 2022-03-30 DIAGNOSIS — C439 Malignant melanoma of skin, unspecified: Secondary | ICD-10-CM

## 2022-03-30 NOTE — Progress Notes (Signed)
Patient referred to [facility] for evaluation, WLE and lymphoscintigraphy for biopsy proven Melanoma

## 2022-03-31 ENCOUNTER — Ambulatory Visit: Payer: Medicare HMO | Admitting: Dermatology

## 2022-03-31 ENCOUNTER — Other Ambulatory Visit: Payer: Self-pay

## 2022-03-31 DIAGNOSIS — C4359 Malignant melanoma of other part of trunk: Secondary | ICD-10-CM

## 2022-03-31 DIAGNOSIS — C439 Malignant melanoma of skin, unspecified: Secondary | ICD-10-CM

## 2022-03-31 DIAGNOSIS — Z7189 Other specified counseling: Secondary | ICD-10-CM

## 2022-03-31 NOTE — Progress Notes (Signed)
   Follow-Up Visit   Subjective  Daniel Hansen is a 71 y.o. male who presents for the following: Follow-up (Biopsy follow up - Melanoma of right back infrascapular).  Accompanied by wife  The following portions of the chart were reviewed this encounter and updated as appropriate:   Tobacco  Allergies  Meds  Problems  Med Hx  Surg Hx  Fam Hx     Review of Systems:  No other skin or systemic complaints except as noted in HPI or Assessment and Plan.  Objective  Well appearing patient in no apparent distress; mood and affect are within normal limits.  A focused examination was performed including neck, back, axillary areas. Relevant physical exam findings are noted in the Assessment and Plan.  Right back infrascapular Healing biopsy site. No lymphadenopathy of axillary areas or neck   Assessment & Plan  Malignant melanoma of skin (HCC) Right back infrascapular  COUNSELING: Melanoma Matrix Counseling  Discussed diagnosis in detail including significance of melanoma diagnosis which can be potentially lethal.  Discussed treatment recommendations in detail advising that treatment recommendations are based on longitudinal studies and retrospective studies and are nationwide protocols.  Advised there is always potential for melanoma recurrence even after definitive treatment.  After definitive treatment, we recommend Skin Cancer Screening Exams (with total-body skin exams) every 3 months for a year; then every 4 months for a year; then every 6 months for 3 years.  At 5 years post treatment, if all appears well,  we would recommend at least yearly Skin Cancer Screenings (with total-body skin exams) for the rest of your life.  The patient was given time for questions and these were answered.  We recommend frequent self skin examinations; photoprotection with sunscreen, sun protective clothing, hats, sunglasses and sun avoidance.  If the patient notices any new or changing skin lesions the  patient should return to the office immediately for evaluation.   Discussed need for WLE and lymphoscintigraphy. Recommend treatment at [facility] or [facility] - patient states that he has had a bad experience at both and he would rather not go to either place. We will explore options in [place]. If there is not an ideal treatment option in [place], we will schedule him at [facility].  Return for Follow up as scheduled.  I, Ashok Cordia, CMA, am acting as scribe for Sarina Ser, MD . Documentation: I have reviewed the above documentation for accuracy and completeness, and I agree with the above.  Sarina Ser, MD

## 2022-03-31 NOTE — Patient Instructions (Signed)
Melanoma  Melanoma is the most dangerous type of skin cancer, and is the leading cause of death from skin disease.  You are more likely to develop melanoma if you: Have light-colored skin, light-colored eyes, or red or blond hair Spend a lot of time in the sun Tan regularly, either outdoors or in a tanning bed Have had blistering sunburns, especially during childhood Have a close family member who has had a melanoma Have atypical moles or large birthmarks  The first sign of melanoma is often a change in a mole or a new dark spot.  The ABCDE system is a way of remembering the signs of melanoma. A for asymmetry:  The two halves do not match. B for border:  The edges of the growth are irregular. C for color:  A mixture of colors are present instead of an even brown color. D for diameter:  Melanomas are usually (but not always) greater than 525m - the size of a pencil eraser. E for evolution:  The spot keeps changing in size, shape, and color. If your doctor thinks that you might have a skin cancer, he or she will perform a biopsy.  A piece of skin is removed and sent to a laboratory for examination under a microscope.  This is the best test for melanoma.  Melanoma is almost always treated with surgery.  The cancer and an area of skin surrounding it are removed.  For early melanomas, this is sometimes the only treatment that is necessary.  For some melanomas, a sentinel node biopsy will be performed.  When melanoma starts to spread to other parts of the body, it will usually go to a nearby lymph node first.  This is called the sentinel lymph node, and it can be found with a special x-ray test.  The surgeon can biopsy this lymph node to see if the cancer has already spread.  When melanoma has spread to other organs, treatment is much more difficult.  Metastatic melanoma is melanoma that is found elsewhere in the body.  It usually cannot be cured.  Treatment involves shrinking the cancer spots for  comfort and for the longest possible survival time.  This can be done with chemotherapy, immune therapies, radiation, or additional surgery.  How well a patient does depends on many things, but the most important of these is how quickly the cancer was found.  If caught early, melanoma can be cured.  Doctors use the depth of the melanoma as a measure of how likely it is that the melanoma has spread.  Melanomas less than 177mdeep in the skin have very little chance of having spread, whereas melanomas deeper than 25m3mre more likely to have spread to other organs.  If you have had a melanoma and have been successfully treated, it is very important to examine your skin regularly for any changes.  Frequent skin exams by a doctor are recommended.  Often these are done every three months for the first two years after a melanoma, and every six to twelve months after that.  Your risk for melanoma is increased after you have had one.  Melanoma may return years later.  COUNSELING  Discussed diagnosis in detail including significance of melanoma diagnosis which can be potentially lethal.  Discussed treatment recommendations in detail advising that treatment recommendations are based on longitudinal studies and retrospective studies and are nationwide protocols.  Advised there is always potential for recurrence even after definitive treatment.  After definitive treatment, we recommend  total-body skin exams every 3 months for a year; then every 4 months for a year; then every 6 months for 3 years.  At 5 years post treatment, if all looks good we would recommend at least yearly total-body skin exams for the rest of your life.  The patient was given time for questions and these were answered.  We recommend frequent self skin examinations; photoprotection with sunscreen, sun protective clothing, hats, sunglasses and sun avoidance.  If the patient notices any new or changing skin lesions the patient should return to the  office immediately for evaluation.

## 2022-04-06 DIAGNOSIS — C4359 Malignant melanoma of other part of trunk: Secondary | ICD-10-CM | POA: Diagnosis not present

## 2022-04-07 DIAGNOSIS — C4359 Malignant melanoma of other part of trunk: Secondary | ICD-10-CM | POA: Diagnosis not present

## 2022-04-10 ENCOUNTER — Encounter: Payer: Self-pay | Admitting: Dermatology

## 2022-04-16 DIAGNOSIS — Z9889 Other specified postprocedural states: Secondary | ICD-10-CM | POA: Diagnosis not present

## 2022-04-16 DIAGNOSIS — L989 Disorder of the skin and subcutaneous tissue, unspecified: Secondary | ICD-10-CM | POA: Diagnosis not present

## 2022-04-16 DIAGNOSIS — N62 Hypertrophy of breast: Secondary | ICD-10-CM | POA: Diagnosis not present

## 2022-04-16 DIAGNOSIS — C4359 Malignant melanoma of other part of trunk: Secondary | ICD-10-CM | POA: Diagnosis not present

## 2022-04-16 DIAGNOSIS — N429 Disorder of prostate, unspecified: Secondary | ICD-10-CM | POA: Diagnosis not present

## 2022-04-26 ENCOUNTER — Ambulatory Visit: Payer: Medicare HMO | Admitting: Dermatology

## 2022-04-26 DIAGNOSIS — C4359 Malignant melanoma of other part of trunk: Secondary | ICD-10-CM | POA: Diagnosis not present

## 2022-04-26 DIAGNOSIS — L821 Other seborrheic keratosis: Secondary | ICD-10-CM | POA: Diagnosis not present

## 2022-04-26 DIAGNOSIS — R569 Unspecified convulsions: Secondary | ICD-10-CM | POA: Diagnosis not present

## 2022-04-26 DIAGNOSIS — E785 Hyperlipidemia, unspecified: Secondary | ICD-10-CM | POA: Diagnosis not present

## 2022-04-26 DIAGNOSIS — E119 Type 2 diabetes mellitus without complications: Secondary | ICD-10-CM | POA: Diagnosis not present

## 2022-04-26 DIAGNOSIS — L923 Foreign body granuloma of the skin and subcutaneous tissue: Secondary | ICD-10-CM | POA: Diagnosis not present

## 2022-04-26 DIAGNOSIS — I10 Essential (primary) hypertension: Secondary | ICD-10-CM | POA: Diagnosis not present

## 2022-04-26 DIAGNOSIS — C439 Malignant melanoma of skin, unspecified: Secondary | ICD-10-CM | POA: Diagnosis not present

## 2022-04-26 DIAGNOSIS — H9190 Unspecified hearing loss, unspecified ear: Secondary | ICD-10-CM | POA: Diagnosis not present

## 2022-04-26 DIAGNOSIS — C779 Secondary and unspecified malignant neoplasm of lymph node, unspecified: Secondary | ICD-10-CM | POA: Diagnosis not present

## 2022-05-05 ENCOUNTER — Telehealth: Payer: Self-pay

## 2022-05-05 NOTE — Telephone Encounter (Signed)
Castle testing results scanned in under Media tab for your review.

## 2022-05-11 ENCOUNTER — Telehealth: Payer: Self-pay

## 2022-05-11 NOTE — Telephone Encounter (Signed)
Called spoke with patient's wife discussed with her, patient should get in touch with Dr Bethel Born office to discuss his recent biopsy results he had at Dr Bethel Born office, pt should also schedule an follow up visit here for hx of Melanoma and mole check, patient scheduled  here Jul 28, 2021 at 8:15

## 2022-05-11 NOTE — Telephone Encounter (Signed)
Castle Testing results emailed to [place] [facility] at [email or web address]  per Dr. Nehemiah Massed

## 2022-05-12 DIAGNOSIS — C4359 Malignant melanoma of other part of trunk: Secondary | ICD-10-CM | POA: Diagnosis not present

## 2022-05-20 ENCOUNTER — Encounter: Payer: Self-pay | Admitting: Podiatry

## 2022-05-20 ENCOUNTER — Ambulatory Visit: Payer: Medicare HMO | Admitting: Podiatry

## 2022-05-20 VITALS — BP 118/54 | HR 54

## 2022-05-20 DIAGNOSIS — Q828 Other specified congenital malformations of skin: Secondary | ICD-10-CM | POA: Diagnosis not present

## 2022-05-20 DIAGNOSIS — M79674 Pain in right toe(s): Secondary | ICD-10-CM

## 2022-05-20 DIAGNOSIS — B351 Tinea unguium: Secondary | ICD-10-CM

## 2022-05-20 DIAGNOSIS — E119 Type 2 diabetes mellitus without complications: Secondary | ICD-10-CM | POA: Diagnosis not present

## 2022-05-20 DIAGNOSIS — M79675 Pain in left toe(s): Secondary | ICD-10-CM

## 2022-05-20 DIAGNOSIS — M21621 Bunionette of right foot: Secondary | ICD-10-CM | POA: Diagnosis not present

## 2022-05-20 NOTE — Progress Notes (Signed)
This patient returns to my office for at risk foot care.  This patient requires this care by a professional since this patient will be at risk due to having diabetes and neuropathy.  He says his big toenails and callus under both feet are painful walking and wearing his shoes. He presents to the office with his caregiver.  He is interested in obtaining diabetic shoes. This patient is unable to cut nails himself since the patient cannot reach his nails.These callus are painful walking and wearing his shoes.  This patient presents for at risk foot care today.  General Appearance  Alert, conversant and in no acute stress.  Vascular  Dorsalis pedis and posterior tibial  pulses are palpable  bilaterally.  Capillary return is within normal limits  bilaterally. Temperature is within normal limits  bilaterally.  Neurologic  Senn-Weinstein monofilament wire test absent  bilaterally. Muscle power within normal limits bilaterally.  Nails Thick disfigured discolored nails with subungual debris  from hallux to fifth toes bilaterally. No evidence of bacterial infection or drainage bilaterally.  Orthopedic  No limitations of motion  feet .  No crepitus or effusions noted.  No bony pathology or digital deformities noted.  Plantar flexed fifth metatarsal  B/L.  Skin  normotropic skin with no porokeratosis noted bilaterally.  No signs of infections or ulcers noted.   Porokeratosis sub 5th met  B/l.    Onychomycosis  Pain in right toes  Pain in left toes  Callus  B/L.   Diabetic neuropathy  Consent was obtained for treatment procedures.   Mechanical debridement of nails 1-5  bilaterally performed with a nail nipper.  Filed with dremel without incident. Debride callus with # 15 blade and dremel tool.  Diabetic foot exam performed.  Patient qualifies for diabetic shoes  To obtain in 2024.   Return office visit     prn                Told patient to return for periodic foot care and evaluation due to potential at risk  complications.   Gardiner Barefoot DPM

## 2022-05-26 ENCOUNTER — Telehealth: Payer: Self-pay

## 2022-05-26 NOTE — Telephone Encounter (Signed)
Updated specimen tracking and history, WLE [facility] 04/26/2022. aw

## 2022-06-03 ENCOUNTER — Ambulatory Visit: Payer: Medicare HMO | Admitting: Urology

## 2022-06-03 ENCOUNTER — Encounter: Payer: Self-pay | Admitting: Urology

## 2022-06-03 VITALS — BP 121/67 | HR 66 | Ht 66.0 in | Wt 247.0 lb

## 2022-06-03 DIAGNOSIS — D494 Neoplasm of unspecified behavior of bladder: Secondary | ICD-10-CM | POA: Diagnosis not present

## 2022-06-03 DIAGNOSIS — Z125 Encounter for screening for malignant neoplasm of prostate: Secondary | ICD-10-CM | POA: Diagnosis not present

## 2022-06-03 DIAGNOSIS — R948 Abnormal results of function studies of other organs and systems: Secondary | ICD-10-CM

## 2022-06-03 NOTE — Progress Notes (Signed)
06/03/2022 8:46 AM   Daniel Hansen 07-20-1950 [id number]  Referring provider: Idelle Crouch, MD [place] [facility],  [name] [address]  Chief Complaint  Patient presents with   Other    HPI: Daniel Hansen is a 71 y.o. male referred for an abnormal PET/CT.  Followed by surgical oncology at [facility] for metastatic melanoma. On an FDG PET/CT incidentally noted to have intense uptake in the apex of the prostate R >L PSA 02/2021 was 1.44 No bothersome LUTS Denies dysuria, gross hematuria   PMH: Past Medical History:  Diagnosis Date   Anal fissure    Arthritis    back   Cancer ([place])    melanoma removed from back 30 yrs ago   Epilepsy ([place])    last seziure years ago    Hyperlipemia    Hypertension    Melanoma ([place])    Back x 2 per patients wife   Melanoma ([place]) 03/18/2022   Right back infrascapula - WLE 04/26/22   Seizure ([place])    last seizure 30 yrs ago   Type 2 diabetes mellitus ([place])    type 2     Surgical History: Past Surgical History:  Procedure Laterality Date   ANAL FISSURE REPAIR     5 or 6 times   LIGATION OF INTERNAL FISTULA TRACT N/A 12/20/2019   Procedure: LIGATION OF INTERNAL FISTULA TRACT;  Surgeon: Leighton Ruff, MD;  Location: [facility];  Service: General;  Laterality: N/A;   melanoma removed from back  30 yrs ago   [place] N/A 04/03/2020   Procedure: ANAL EXAM UNDER ANESTHESIA, incision and drainage of perianal abcess, drain placement;  Surgeon: Leighton Ruff, MD;  Location: [facility];  Service: General;  Laterality: N/A;    Home Medications:  Allergies as of 06/03/2022       Reactions   Penicillins Hives        Medication List        Accurate as of June 03, 2022  8:46 AM. If you have any questions, ask your nurse or doctor.          atorvastatin 80 MG tablet Commonly known as: LIPITOR daily.   atorvastatin 80 MG tablet Commonly known as:  LIPITOR Take 1 tablet by mouth daily.   azithromycin 250 MG tablet Commonly known as: ZITHROMAX   benzonatate 200 MG capsule Commonly known as: TESSALON Take by mouth.   diphenoxylate-atropine 2.5-0.025 MG tablet Commonly known as: LOMOTIL As needed for diarrhea   gabapentin 100 MG capsule Commonly known as: NEURONTIN Take 1 capsule by mouth at bedtime.   glimepiride 4 MG tablet Commonly known as: AMARYL Take 4 mg by mouth daily with breakfast.   levothyroxine 50 MCG tablet Commonly known as: SYNTHROID Take by mouth.   loperamide 2 MG capsule Commonly known as: IMODIUM   losartan-hydrochlorothiazide 100-25 MG tablet Commonly known as: HYZAAR daily.   meclizine 25 MG tablet Commonly known as: ANTIVERT Take 25 mg by mouth 3 (three) times daily as needed for dizziness.   oxyCODONE 5 MG immediate release tablet Commonly known as: Oxy IR/ROXICODONE Take 1 tablet (5 mg total) by mouth every 6 (six) hours as needed (for pain).   PHENobarbital 32.4 MG tablet Commonly known as: LUMINAL daily. Takes 4 in am   phenytoin 100 MG ER capsule Commonly known as: DILANTIN daily. Takes 4 of 100 mg in am  Allergies:  Allergies  Allergen Reactions   Penicillins Hives    Family History: No family history on file.  Social History:  reports that he has never smoked. He has never used smokeless tobacco. He reports that he does not drink alcohol and does not use drugs.   Physical Exam: BP 121/67   Pulse 66   Ht '5\' 6"'$  (1.676 m)   Wt 247 lb (112 kg)   BMI 39.87 kg/m   Constitutional:  Alert and oriented, No acute distress. HEENT: Jet AT Respiratory: Normal respiratory effort, no increased work of breathing. GI: Abdomen is soft, nontender, nondistended, no abdominal masses GU: Prostate 35 g, smooth without nodules Psychiatric: Normal mood and affect.   Assessment & Plan:   71 y.o. male with increased uptake prostate apex on FDG PET.  Typically FDG-PET scans  are not reliable indicators of prostate cancer Schedule prostate MRI PSA today    Abbie Sons, MD  [facility] [address] [name], [name] [address] [phone]

## 2022-06-04 ENCOUNTER — Encounter: Payer: Self-pay | Admitting: Urology

## 2022-06-04 DIAGNOSIS — Z1211 Encounter for screening for malignant neoplasm of colon: Secondary | ICD-10-CM | POA: Diagnosis not present

## 2022-06-04 DIAGNOSIS — E039 Hypothyroidism, unspecified: Secondary | ICD-10-CM | POA: Diagnosis not present

## 2022-06-04 DIAGNOSIS — Z79899 Other long term (current) drug therapy: Secondary | ICD-10-CM | POA: Diagnosis not present

## 2022-06-04 DIAGNOSIS — G40909 Epilepsy, unspecified, not intractable, without status epilepticus: Secondary | ICD-10-CM | POA: Diagnosis not present

## 2022-06-04 DIAGNOSIS — I10 Essential (primary) hypertension: Secondary | ICD-10-CM | POA: Diagnosis not present

## 2022-06-04 DIAGNOSIS — E782 Mixed hyperlipidemia: Secondary | ICD-10-CM | POA: Diagnosis not present

## 2022-06-04 DIAGNOSIS — R7309 Other abnormal glucose: Secondary | ICD-10-CM | POA: Diagnosis not present

## 2022-06-04 DIAGNOSIS — Z0000 Encounter for general adult medical examination without abnormal findings: Secondary | ICD-10-CM | POA: Diagnosis not present

## 2022-06-04 LAB — PSA: Prostate Specific Ag, Serum: 1.7 ng/mL (ref 0.0–4.0)

## 2022-06-08 ENCOUNTER — Telehealth: Payer: Self-pay | Admitting: *Deleted

## 2022-06-08 NOTE — Telephone Encounter (Signed)
-----   Message from Abbie Sons, MD sent at 06/06/2022  9:28 AM EST ----- PSA was normal at 1.7.  Prostate MRI has been ordered.

## 2022-06-08 NOTE — Telephone Encounter (Signed)
Notified patient as instructed.

## 2022-06-23 ENCOUNTER — Ambulatory Visit: Payer: Medicare HMO

## 2022-07-05 ENCOUNTER — Ambulatory Visit
Admission: RE | Admit: 2022-07-05 | Discharge: 2022-07-05 | Disposition: A | Discharge status: Home / Self Care | Payer: Medicare HMO | Source: Ambulatory Visit | Attending: Urology | Admitting: Urology

## 2022-07-05 DIAGNOSIS — N401 Enlarged prostate with lower urinary tract symptoms: Secondary | ICD-10-CM | POA: Diagnosis not present

## 2022-07-05 DIAGNOSIS — R948 Abnormal results of function studies of other organs and systems: Secondary | ICD-10-CM | POA: Diagnosis not present

## 2022-07-05 DIAGNOSIS — R972 Elevated prostate specific antigen [PSA]: Secondary | ICD-10-CM | POA: Diagnosis not present

## 2022-07-05 MED ORDER — GADOBUTROL 1 MMOL/ML IV SOLN
10.0000 mL | Freq: Once | INTRAVENOUS | Status: AC | PRN
  Administered 2022-07-05: 10 mL via INTRAVENOUS

## 2022-07-09 ENCOUNTER — Telehealth: Payer: Self-pay | Admitting: Urology

## 2022-07-09 DIAGNOSIS — R972 Elevated prostate specific antigen [PSA]: Secondary | ICD-10-CM

## 2022-07-19 NOTE — Addendum Note (Signed)
Addended by: Abbie Sons on: 07/19/2022 08:08 PM   Modules accepted: Orders

## 2022-07-19 NOTE — Telephone Encounter (Signed)
I called patient to discuss MRI results but got his voicemail.  He did have an abnormality suspicious for prostate cancer and recommend scheduling a fusion biopsy.  Please see if Larene Beach or Inocente Salles will discuss with him if he has any questions and put in the referral order.  Thanks    Notified patient wife as instructed, patient will get biopsy done.

## 2022-07-21 DIAGNOSIS — C4359 Malignant melanoma of other part of trunk: Secondary | ICD-10-CM | POA: Diagnosis not present

## 2022-07-21 DIAGNOSIS — E039 Hypothyroidism, unspecified: Secondary | ICD-10-CM | POA: Diagnosis not present

## 2022-07-23 ENCOUNTER — Other Ambulatory Visit: Payer: Self-pay | Admitting: Student in an Organized Health Care Education/Training Program

## 2022-07-23 DIAGNOSIS — C4359 Malignant melanoma of other part of trunk: Secondary | ICD-10-CM

## 2022-07-28 ENCOUNTER — Ambulatory Visit: Payer: Medicare HMO | Admitting: Dermatology

## 2022-07-30 ENCOUNTER — Other Ambulatory Visit: Payer: Self-pay | Admitting: Student in an Organized Health Care Education/Training Program

## 2022-07-30 DIAGNOSIS — C4359 Malignant melanoma of other part of trunk: Secondary | ICD-10-CM

## 2022-08-09 ENCOUNTER — Encounter
Admission: RE | Admit: 2022-08-09 | Discharge: 2022-08-09 | Disposition: A | Discharge status: Home / Self Care | Payer: Medicare HMO | Source: Ambulatory Visit | Attending: Student in an Organized Health Care Education/Training Program | Admitting: Student in an Organized Health Care Education/Training Program

## 2022-08-09 DIAGNOSIS — C4359 Malignant melanoma of other part of trunk: Secondary | ICD-10-CM

## 2022-08-09 LAB — GLUCOSE, CAPILLARY: Glucose-Capillary: 109 mg/dL — ABNORMAL HIGH (ref 70–99)

## 2022-08-11 ENCOUNTER — Ambulatory Visit: Payer: Medicare HMO | Admitting: Dermatology

## 2022-08-13 ENCOUNTER — Ambulatory Visit
Admission: RE | Admit: 2022-08-13 | Discharge: 2022-08-13 | Disposition: A | Discharge status: Home / Self Care | Payer: Medicare HMO | Source: Ambulatory Visit | Attending: Student in an Organized Health Care Education/Training Program | Admitting: Student in an Organized Health Care Education/Training Program

## 2022-08-13 ENCOUNTER — Ambulatory Visit: Payer: Medicare HMO

## 2022-08-13 DIAGNOSIS — C4359 Malignant melanoma of other part of trunk: Secondary | ICD-10-CM | POA: Insufficient documentation

## 2022-08-13 DIAGNOSIS — I700 Atherosclerosis of aorta: Secondary | ICD-10-CM | POA: Diagnosis not present

## 2022-08-13 DIAGNOSIS — C61 Malignant neoplasm of prostate: Secondary | ICD-10-CM | POA: Diagnosis not present

## 2022-08-13 DIAGNOSIS — C4490 Unspecified malignant neoplasm of skin, unspecified: Secondary | ICD-10-CM

## 2022-08-13 LAB — GLUCOSE, CAPILLARY: Glucose-Capillary: 100 mg/dL — ABNORMAL HIGH (ref 70–99)

## 2022-08-13 MED ORDER — FLUDEOXYGLUCOSE F - 18 (FDG) INJECTION
12.5100 | Freq: Once | INTRAVENOUS | Status: AC | PRN
  Administered 2022-08-13: 12.51 via INTRAVENOUS

## 2022-08-23 ENCOUNTER — Ambulatory Visit: Payer: Medicare HMO

## 2022-08-26 DIAGNOSIS — R948 Abnormal results of function studies of other organs and systems: Secondary | ICD-10-CM | POA: Diagnosis not present

## 2022-09-01 DIAGNOSIS — E119 Type 2 diabetes mellitus without complications: Secondary | ICD-10-CM | POA: Diagnosis not present

## 2022-09-01 DIAGNOSIS — K6289 Other specified diseases of anus and rectum: Secondary | ICD-10-CM | POA: Diagnosis not present

## 2022-09-01 DIAGNOSIS — E785 Hyperlipidemia, unspecified: Secondary | ICD-10-CM | POA: Diagnosis not present

## 2022-09-01 DIAGNOSIS — G40909 Epilepsy, unspecified, not intractable, without status epilepticus: Secondary | ICD-10-CM | POA: Diagnosis not present

## 2022-09-01 DIAGNOSIS — C4359 Malignant melanoma of other part of trunk: Secondary | ICD-10-CM | POA: Diagnosis not present

## 2022-09-01 DIAGNOSIS — I10 Essential (primary) hypertension: Secondary | ICD-10-CM | POA: Diagnosis not present

## 2022-09-01 DIAGNOSIS — Z79899 Other long term (current) drug therapy: Secondary | ICD-10-CM | POA: Diagnosis not present

## 2022-09-01 DIAGNOSIS — E039 Hypothyroidism, unspecified: Secondary | ICD-10-CM | POA: Diagnosis not present

## 2022-09-09 ENCOUNTER — Ambulatory Visit: Payer: Medicare HMO | Admitting: Dermatology

## 2022-09-09 DIAGNOSIS — L578 Other skin changes due to chronic exposure to nonionizing radiation: Secondary | ICD-10-CM

## 2022-09-09 DIAGNOSIS — D229 Melanocytic nevi, unspecified: Secondary | ICD-10-CM

## 2022-09-09 DIAGNOSIS — Z7189 Other specified counseling: Secondary | ICD-10-CM

## 2022-09-09 DIAGNOSIS — L821 Other seborrheic keratosis: Secondary | ICD-10-CM

## 2022-09-09 DIAGNOSIS — Z1283 Encounter for screening for malignant neoplasm of skin: Secondary | ICD-10-CM

## 2022-09-09 DIAGNOSIS — L570 Actinic keratosis: Secondary | ICD-10-CM

## 2022-09-09 DIAGNOSIS — Z85820 Personal history of malignant melanoma of skin: Secondary | ICD-10-CM | POA: Diagnosis not present

## 2022-09-09 DIAGNOSIS — L814 Other melanin hyperpigmentation: Secondary | ICD-10-CM | POA: Diagnosis not present

## 2022-09-09 DIAGNOSIS — L820 Inflamed seborrheic keratosis: Secondary | ICD-10-CM

## 2022-09-09 DIAGNOSIS — D1801 Hemangioma of skin and subcutaneous tissue: Secondary | ICD-10-CM

## 2022-09-09 NOTE — Progress Notes (Deleted)
   Follow-Up Visit   Subjective  Daniel Hansen is a 72 y.o. male who presents for the following: No chief complaint on file..  ***  The following portions of the chart were reviewed this encounter and updated as appropriate:       Review of Systems:  No other skin or systemic complaints except as noted in HPI or Assessment and Plan.  Objective  Well appearing patient in no apparent distress; mood and affect are within normal limits.  [id number] full examination was performed including scalp, head, eyes, ears, nose, lips, neck, chest, axillae, abdomen, back, buttocks, bilateral upper extremities, bilateral lower extremities, hands, feet, fingers, toes, fingernails, and toenails. All findings within normal limits unless otherwise noted below."}    Assessment & Plan   No follow-ups on file.

## 2022-09-09 NOTE — Patient Instructions (Addendum)
Cryotherapy Aftercare  Wash gently with soap and water everyday.   Apply Vaseline and Band-Aid daily until healed.     Due to recent changes in healthcare laws, you may see results of your pathology and/or laboratory studies on MyChart before the doctors have had a chance to review them. We understand that in some cases there may be results that are confusing or concerning to you. Please understand that not all results are received at the same time and often the doctors may need to interpret multiple results in order to provide you with the best plan of care or course of treatment. Therefore, we ask that you please give us 2 business days to thoroughly review all your results before contacting the office for clarification. Should we see a critical lab result, you will be contacted sooner.   If You Need Anything After Your Visit  If you have any questions or concerns for your doctor, please call our main line at [phone] and press option 4 to reach your doctor's medical assistant. If no one answers, please leave a voicemail as directed and we will return your call as soon as possible. Messages left after 4 pm will be answered the following business day.   You may also send us a message via MyChart. We typically respond to MyChart messages within 1-2 business days.  For prescription refills, please ask your pharmacy to contact our office. Our fax number is [phone].  If you have an urgent issue when the clinic is closed that cannot wait until the next business day, you can page your doctor at the number below.    Please note that while we do our best to be available for urgent issues outside of office hours, we are not available 24/7.   If you have an urgent issue and are unable to reach us, you may choose to seek medical care at your doctor's office, retail clinic, urgent care center, or emergency room.  If you have a medical emergency, please immediately call 911 or go to the  emergency department.  Pager Numbers  - Dr. Kowalski: [phone]  - Dr. Moye: [phone]  - Dr. Stewart: [phone]  In the event of inclement weather, please call our main line at [phone] for an update on the status of any delays or closures.  Dermatology Medication Tips: Please keep the boxes that topical medications come in in order to help keep track of the instructions about where and how to use these. Pharmacies typically print the medication instructions only on the boxes and not directly on the medication tubes.   If your medication is too expensive, please contact our office at [phone] option 4 or send us a message through MyChart.   We are unable to tell what your co-pay for medications will be in advance as this is different depending on your insurance coverage. However, we may be able to find a substitute medication at lower cost or fill out paperwork to get insurance to cover a needed medication.   If a prior authorization is required to get your medication covered by your insurance company, please allow us 1-2 business days to complete this process.  Drug prices often vary depending on where the prescription is filled and some pharmacies may offer cheaper prices.  The website www.goodrx.com contains coupons for medications through different pharmacies. The prices here do not account for what the cost may be with help from insurance (it may be cheaper with your insurance), but the website can   give you the price if you did not use any insurance.  - You can print the associated coupon and take it with your prescription to the pharmacy.  - You may also stop by our office during regular business hours and pick up a GoodRx coupon card.  - If you need your prescription sent electronically to a different pharmacy, notify our office through [place] MyChart or by phone at [phone] option 4.     Si Usted Necesita Algo Despus de Su Visita  Tambin puede  enviarnos un mensaje a travs de MyChart. Por lo general respondemos a los mensajes de MyChart en el transcurso de 1 a 2 das hbiles.  Para renovar recetas, por favor pida a su farmacia que se ponga en contacto con nuestra oficina. Nuestro nmero de fax es el [phone].  Si tiene un asunto urgente cuando la clnica est cerrada y que no puede esperar hasta el siguiente da hbil, puede llamar/localizar a su doctor(a) al nmero que aparece a continuacin.   Por favor, tenga en cuenta que aunque hacemos todo lo posible para estar disponibles para asuntos urgentes fuera del horario de oficina, no estamos disponibles las 24 horas del da, los 7 das de la semana.   Si tiene un problema urgente y no puede comunicarse con nosotros, puede optar por buscar atencin mdica  en el consultorio de su doctor(a), en una clnica privada, en un centro de atencin urgente o en una sala de emergencias.  Si tiene una emergencia mdica, por favor llame inmediatamente al 911 o vaya a la sala de emergencias.  Nmeros de bper  - Dr. Kowalski: [phone]  - Dra. Moye: [phone]  - Dra. Stewart: [phone]  En caso de inclemencias del tiempo, por favor llame a nuestra lnea principal al [phone] para una actualizacin sobre el estado de cualquier retraso o cierre.  Consejos para la medicacin en dermatologa: Por favor, guarde las cajas en las que vienen los medicamentos de uso tpico para ayudarle a seguir las instrucciones sobre dnde y cmo usarlos. Las farmacias generalmente imprimen las instrucciones del medicamento slo en las cajas y no directamente en los tubos del medicamento.   Si su medicamento es muy caro, por favor, pngase en contacto con nuestra oficina llamando al [phone] y presione la opcin 4 o envenos un mensaje a travs de MyChart.   No podemos decirle cul ser su copago por los medicamentos por adelantado ya que esto es diferente dependiendo de la cobertura de su seguro.  Sin embargo, es posible que podamos encontrar un medicamento sustituto a menor costo o llenar un formulario para que el seguro cubra el medicamento que se considera necesario.   Si se requiere una autorizacin previa para que su compaa de seguros cubra su medicamento, por favor permtanos de 1 a 2 das hbiles para completar este proceso.  Los precios de los medicamentos varan con frecuencia dependiendo del lugar de dnde se surte la receta y alguna farmacias pueden ofrecer precios ms baratos.  El sitio web www.goodrx.com tiene cupones para medicamentos de diferentes farmacias. Los precios aqu no tienen en cuenta lo que podra costar con la ayuda del seguro (puede ser ms barato con su seguro), pero el sitio web puede darle el precio si no utiliz ningn seguro.  - Puede imprimir el cupn correspondiente y llevarlo con su receta a la farmacia.  - Tambin puede pasar por nuestra oficina durante el horario de atencin regular y recoger una tarjeta de cupones de GoodRx.  -   Si necesita que su receta se enve electrnicamente a una farmacia diferente, informe a nuestra oficina a travs de MyChart de [place] o por telfono llamando al [phone] y presione la opcin 4.  

## 2022-09-09 NOTE — Progress Notes (Deleted)
   Follow-Up Visit   Subjective  Daniel Hansen is a 72 y.o. 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  Objective  Well appearing patient in no apparent distress; mood and affect are within normal limits.  A focused examination was performed of the following areas: back    Relevant exam findings are noted in the Assessment and Plan.    Assessment & Plan       No follow-ups on file.  IMarye Round, CMA, am acting as scribe for Sarina Ser, MD .   Documentation: I have reviewed the above documentation for accuracy and completeness, and I agree with the above.  Sarina Ser, MD

## 2022-09-09 NOTE — Progress Notes (Signed)
Follow-Up Visit   Subjective  Daniel Hansen is a 72 y.o. male who presents for the following: Skin Cancer Screening and Upper Body Skin Exam, hx of Melanoma on his right back- Pt had wide local excision at [facility] 04/26/2022 - Margins free Sentinel Lymph node biopsy from [place] showed 1 positive Lymph node with Melanoma, patient recently started infusions at [facility] oncology.   The patient presents for Upper Body Skin Exam (UBSE) for skin cancer screening and mole check. The patient has spots, moles and lesions to be evaluated, some may be new or changing and the patient has concerns that these could be cancer.  Wife with patient   The following portions of the chart were reviewed this encounter and updated as appropriate: medications, allergies, medical history  Review of Systems:  No other skin or systemic complaints except as noted in HPI or Assessment and Plan.  Objective  Well appearing patient in no apparent distress; mood and affect are within normal limits.  All skin waist up examined. Relevant physical exam findings are noted in the Assessment and Plan.   Assessment & Plan    Lentigines, Seborrheic Keratoses, Hemangiomas - Benign normal skin lesions - Benign-appearing - Call for any changes  Melanocytic Nevi - Tan-brown and/or pink-flesh-colored symmetric macules and papules - Benign appearing on exam today - Observation - Call clinic for new or changing moles - Recommend daily use of broad spectrum spf 30+ sunscreen to sun-exposed areas.   Actinic Damage - Chronic condition, secondary to cumulative UV/sun exposure - diffuse scaly erythematous macules with underlying dyspigmentation - Recommend daily broad spectrum sunscreen SPF 30+ to sun-exposed areas, reapply every 2 hours as needed.  - Staying in the shade or wearing long sleeves, sun glasses (UVA+UVB protection) and wide brim hats (4-inch brim around the entire circumference of the hat) are also recommended for sun  protection.  - Call for new or changing lesions.  INFLAMED SEBORRHEIC KERATOSIS Exam: Erythematous keratotic or waxy stuck-on papule or plaque.  Symptomatic, irritating, patient would like treated.  Benign-appearing.  Call clinic for new or changing lesions.   Prior to procedure, discussed risks of blister formation, small wound, skin dyspigmentation, or rare scar following treatment. Recommend Vaseline ointment to treated areas while healing.  Destruction Procedure Note Destruction method: cryotherapy   Informed consent: discussed and consent obtained   Lesion destroyed using liquid nitrogen: Yes   Outcome: patient tolerated procedure well with no complications   Post-procedure details: wound care instructions given   Locations: 6 # of Lesions Treated: left arm    ACTINIC KERATOSIS Exam: Erythematous thin papules/macules with gritty scale  Actinic keratoses are precancerous spots that appear secondary to cumulative UV radiation exposure/sun exposure over time. They are chronic with expected duration over 1 year. A portion of actinic keratoses will progress to squamous cell carcinoma of the skin. It is not possible to reliably predict which spots will progress to skin cancer and so treatment is recommended to prevent development of skin cancer.  Recommend daily broad spectrum sunscreen SPF 30+ to sun-exposed areas, reapply every 2 hours as needed.  Recommend staying in the shade or wearing long sleeves, sun glasses (UVA+UVB protection) and wide brim hats (4-inch brim around the entire circumference of the hat). Call for new or changing lesions.  Treatment Plan:  Prior to procedure, discussed risks of blister formation, small wound, skin dyspigmentation, or rare scar following cryotherapy. Recommend Vaseline ointment to treated areas while healing. Destruction Procedure Note Destruction method:  cryotherapy   Informed consent: discussed and consent obtained   Lesion destroyed using  liquid nitrogen: Yes   Outcome: patient tolerated procedure well with no complications   Post-procedure details: wound care instructions given   Locations: face,scalp,forehead, ears  # of Lesions Treated: 16   HISTORY OF MELANOMA- wide local excision at [facility] 04/26/2022 - Margins free Sentinel Lymph node biopsy from [place] showed 1 positive Lymph node with Melanoma, patient recently started infusions at [facility] oncology.  - No evidence of recurrence today - No lymphadenopathy - Recommend regular full body skin exams - Recommend daily broad spectrum sunscreen SPF 30+ to sun-exposed areas, reapply every 2 hours as needed.  - Call if any new or changing lesions are noted between office visits  Due to patient being treated with infusions at [facility] we will see patient every 4 months, rather than every 3 months as per melanoma matrix protocol..  Skin cancer screening performed today.  Return in about 4 months (around 01/09/2023) for UBSE, hx of Melanoma .  IMarye Round, CMA, am acting as scribe for Sarina Ser, MD .   Documentation: I have reviewed the above documentation for accuracy and completeness, and I agree with the above.  Sarina Ser, MD

## 2022-09-14 ENCOUNTER — Encounter: Payer: Self-pay | Admitting: Dermatology

## 2022-09-16 DIAGNOSIS — C4359 Malignant melanoma of other part of trunk: Secondary | ICD-10-CM | POA: Diagnosis not present

## 2022-09-16 DIAGNOSIS — I63512 Cerebral infarction due to unspecified occlusion or stenosis of left middle cerebral artery: Secondary | ICD-10-CM | POA: Diagnosis not present

## 2022-09-16 DIAGNOSIS — Z8673 Personal history of transient ischemic attack (TIA), and cerebral infarction without residual deficits: Secondary | ICD-10-CM | POA: Diagnosis not present

## 2022-09-22 DIAGNOSIS — E039 Hypothyroidism, unspecified: Secondary | ICD-10-CM | POA: Diagnosis not present

## 2022-09-22 DIAGNOSIS — E119 Type 2 diabetes mellitus without complications: Secondary | ICD-10-CM | POA: Diagnosis not present

## 2022-09-22 DIAGNOSIS — Z1159 Encounter for screening for other viral diseases: Secondary | ICD-10-CM | POA: Diagnosis not present

## 2022-09-22 DIAGNOSIS — G40909 Epilepsy, unspecified, not intractable, without status epilepticus: Secondary | ICD-10-CM | POA: Diagnosis not present

## 2022-09-22 DIAGNOSIS — E785 Hyperlipidemia, unspecified: Secondary | ICD-10-CM | POA: Diagnosis not present

## 2022-09-22 DIAGNOSIS — C773 Secondary and unspecified malignant neoplasm of axilla and upper limb lymph nodes: Secondary | ICD-10-CM | POA: Diagnosis not present

## 2022-09-22 DIAGNOSIS — R21 Rash and other nonspecific skin eruption: Secondary | ICD-10-CM | POA: Diagnosis not present

## 2022-09-22 DIAGNOSIS — L270 Generalized skin eruption due to drugs and medicaments taken internally: Secondary | ICD-10-CM | POA: Diagnosis not present

## 2022-09-22 DIAGNOSIS — Z5112 Encounter for antineoplastic immunotherapy: Secondary | ICD-10-CM | POA: Diagnosis not present

## 2022-09-22 DIAGNOSIS — I10 Essential (primary) hypertension: Secondary | ICD-10-CM | POA: Diagnosis not present

## 2022-09-22 DIAGNOSIS — Z79899 Other long term (current) drug therapy: Secondary | ICD-10-CM | POA: Diagnosis not present

## 2022-09-22 DIAGNOSIS — C4359 Malignant melanoma of other part of trunk: Secondary | ICD-10-CM | POA: Diagnosis not present

## 2022-10-08 DIAGNOSIS — E785 Hyperlipidemia, unspecified: Secondary | ICD-10-CM | POA: Diagnosis not present

## 2022-10-08 DIAGNOSIS — E119 Type 2 diabetes mellitus without complications: Secondary | ICD-10-CM | POA: Diagnosis not present

## 2022-10-08 DIAGNOSIS — I10 Essential (primary) hypertension: Secondary | ICD-10-CM | POA: Diagnosis not present

## 2022-10-08 DIAGNOSIS — C439 Malignant melanoma of skin, unspecified: Secondary | ICD-10-CM | POA: Diagnosis not present

## 2022-10-08 DIAGNOSIS — Z79899 Other long term (current) drug therapy: Secondary | ICD-10-CM | POA: Diagnosis not present

## 2022-10-08 DIAGNOSIS — R569 Unspecified convulsions: Secondary | ICD-10-CM | POA: Diagnosis not present

## 2022-10-08 DIAGNOSIS — E079 Disorder of thyroid, unspecified: Secondary | ICD-10-CM | POA: Diagnosis not present

## 2022-10-12 ENCOUNTER — Encounter: Payer: Self-pay | Admitting: Internal Medicine

## 2022-10-13 ENCOUNTER — Ambulatory Visit
Admission: RE | Admit: 2022-10-13 | Discharge: 2022-10-13 | Disposition: A | Discharge status: Home / Self Care | Payer: Medicare HMO | Attending: Internal Medicine | Admitting: Internal Medicine

## 2022-10-13 ENCOUNTER — Encounter: Payer: Self-pay | Admitting: Internal Medicine

## 2022-10-13 ENCOUNTER — Ambulatory Visit: Payer: Medicare HMO | Admitting: Anesthesiology

## 2022-10-13 ENCOUNTER — Encounter
Admission: RE | Disposition: A | Discharge status: Home / Self Care | Payer: Self-pay | Source: Home / Self Care | Attending: Internal Medicine

## 2022-10-13 ENCOUNTER — Other Ambulatory Visit: Payer: Self-pay

## 2022-10-13 DIAGNOSIS — K640 First degree hemorrhoids: Secondary | ICD-10-CM | POA: Diagnosis not present

## 2022-10-13 DIAGNOSIS — K649 Unspecified hemorrhoids: Secondary | ICD-10-CM | POA: Diagnosis not present

## 2022-10-13 DIAGNOSIS — K2970 Gastritis, unspecified, without bleeding: Secondary | ICD-10-CM | POA: Insufficient documentation

## 2022-10-13 DIAGNOSIS — D125 Benign neoplasm of sigmoid colon: Secondary | ICD-10-CM | POA: Diagnosis not present

## 2022-10-13 DIAGNOSIS — E119 Type 2 diabetes mellitus without complications: Secondary | ICD-10-CM | POA: Diagnosis not present

## 2022-10-13 DIAGNOSIS — Q438 Other specified congenital malformations of intestine: Secondary | ICD-10-CM | POA: Insufficient documentation

## 2022-10-13 DIAGNOSIS — D120 Benign neoplasm of cecum: Secondary | ICD-10-CM | POA: Diagnosis not present

## 2022-10-13 DIAGNOSIS — G40909 Epilepsy, unspecified, not intractable, without status epilepticus: Secondary | ICD-10-CM | POA: Insufficient documentation

## 2022-10-13 DIAGNOSIS — R933 Abnormal findings on diagnostic imaging of other parts of digestive tract: Secondary | ICD-10-CM | POA: Insufficient documentation

## 2022-10-13 DIAGNOSIS — K635 Polyp of colon: Secondary | ICD-10-CM | POA: Diagnosis not present

## 2022-10-13 DIAGNOSIS — I10 Essential (primary) hypertension: Secondary | ICD-10-CM | POA: Diagnosis not present

## 2022-10-13 HISTORY — PX: ESOPHAGOGASTRODUODENOSCOPY (EGD) WITH PROPOFOL: SHX5813

## 2022-10-13 HISTORY — PX: COLONOSCOPY WITH PROPOFOL: SHX5780

## 2022-10-13 LAB — GLUCOSE, CAPILLARY: Glucose-Capillary: 94 mg/dL (ref 70–99)

## 2022-10-13 SURGERY — COLONOSCOPY WITH PROPOFOL
Anesthesia: General

## 2022-10-13 MED ORDER — MIDAZOLAM HCL 2 MG/2ML IJ SOLN
INTRAMUSCULAR | Status: DC | PRN
  Administered 2022-10-13 (×2): 1 mg via INTRAVENOUS

## 2022-10-13 MED ORDER — SODIUM CHLORIDE 0.9 % IV SOLN: INTRAVENOUS | Status: DC

## 2022-10-13 MED ORDER — FENTANYL CITRATE (PF) 100 MCG/2ML IJ SOLN
INTRAMUSCULAR | Status: AC
  Filled 2022-10-13: qty 2

## 2022-10-13 MED ORDER — MIDAZOLAM HCL 2 MG/2ML IJ SOLN
INTRAMUSCULAR | Status: AC
  Filled 2022-10-13: qty 2

## 2022-10-13 MED ORDER — PROPOFOL 10 MG/ML IV BOLUS
INTRAVENOUS | Status: DC | PRN
  Administered 2022-10-13: 100 mg via INTRAVENOUS

## 2022-10-13 MED ORDER — FENTANYL CITRATE (PF) 100 MCG/2ML IJ SOLN
INTRAMUSCULAR | Status: DC | PRN
  Administered 2022-10-13 (×4): 25 ug via INTRAVENOUS

## 2022-10-13 MED ORDER — PROPOFOL 500 MG/50ML IV EMUL
INTRAVENOUS | Status: DC | PRN
  Administered 2022-10-13: 100 ug/kg/min via INTRAVENOUS

## 2022-10-13 NOTE — H&P (Signed)
Outpatient short stay form Pre-procedure 10/13/2022 1:22 PM Aarya Robinson K. Norma Fredrickson, M.D.  Primary Physician: Aram Beecham, M.D.  Reason for visit:    History of present illness:  72 y/o wm presents for abnormal PET scan. Abnormal PET of rectal area (possibly related to his fistula history but ddx broader) and esophagus.     Current Facility-Administered Medications:    0.9 %  sodium chloride infusion, , Intravenous, Continuous, [place], Boykin Nearing, MD, Last Rate: 20 mL/hr at 10/13/22 1301, New Bag at 10/13/22 1301  Medications Prior to Admission  Medication Sig Dispense Refill Last Dose   atorvastatin (LIPITOR) 80 MG tablet Take 1 tablet by mouth daily.   10/12/2022   gabapentin (NEURONTIN) 100 MG capsule Take 1 capsule by mouth at bedtime.   10/12/2022   glimepiride (AMARYL) 4 MG tablet Take 4 mg by mouth daily with breakfast.   10/12/2022   losartan-hydrochlorothiazide (HYZAAR) 100-25 MG tablet daily.    10/12/2022   PHENobarbital (LUMINAL) 32.4 MG tablet daily. Takes 4 in am   10/13/2022 at 0600   phenytoin (DILANTIN) 100 MG ER capsule daily. Takes 4 of 100 mg in am   10/13/2022 at 0600   atorvastatin (LIPITOR) 80 MG tablet daily.       azithromycin (ZITHROMAX) 250 MG tablet       benzonatate (TESSALON) 200 MG capsule Take by mouth.      diphenoxylate-atropine (LOMOTIL) 2.5-0.025 MG tablet As needed for diarrhea      levothyroxine (SYNTHROID) 50 MCG tablet Take by mouth.      loperamide (IMODIUM) 2 MG capsule       meclizine (ANTIVERT) 25 MG tablet Take 25 mg by mouth 3 (three) times daily as needed for dizziness.      oxyCODONE (OXY IR/ROXICODONE) 5 MG immediate release tablet Take 1 tablet (5 mg total) by mouth every 6 (six) hours as needed (for pain). (Patient not taking: Reported on 10/13/2022) 30 tablet 0 Completed Course     Allergies  Allergen Reactions   Penicillins Hives     Past Medical History:  Diagnosis Date   Anal fissure    Arthritis    back   Cancer (HCC)    melanoma  removed from back 30 yrs ago   Epilepsy (HCC)    last seziure years ago    Hyperlipemia    Hypertension    Melanoma (HCC)    Back x 2 per patients wife   Melanoma (HCC) 03/18/2022   Right back infrascapula - [facility] 04/26/22   Seizure (HCC)    last seizure 30 yrs ago   Type 2 diabetes mellitus (HCC)    type 2     Review of systems:  Otherwise negative.    Physical Exam  Gen: Alert, oriented. Appears stated age.  HEENT: [place]/AT. PERRLA. Lungs: CTA, no wheezes. CV: RR nl S1, S2. Abd: soft, benign, no masses. BS+ Ext: No edema. Pulses 2+    Planned procedures: Proceed with EGD and colonoscopy. The patient understands the nature of the planned procedure, indications, risks, alternatives and potential complications including but not limited to bleeding, infection, perforation, damage to internal organs and possible oversedation/side effects from anesthesia. The patient agrees and gives consent to proceed.  Please refer to procedure notes for findings, recommendations and patient disposition/instructions.     Ambre Kobayashi K. Norma Fredrickson, M.D. Gastroenterology 10/13/2022  1:22 PM

## 2022-10-13 NOTE — Anesthesia Preprocedure Evaluation (Signed)
Anesthesia Evaluation  Patient identified by MRN, date of birth, ID band Patient confused    Reviewed: Allergy & Precautions, NPO status , Patient's Chart, lab work & pertinent test results  History of Anesthesia Complications Negative for: history of anesthetic complications  Airway Mallampati: III  TM Distance: <3 FB Neck ROM: full    Dental  (+) Chipped   Pulmonary neg shortness of breath   Pulmonary exam normal        Cardiovascular Exercise Tolerance: Good hypertension, Normal cardiovascular exam     Neuro/Psych Seizures -, Well Controlled,   negative psych ROS   GI/Hepatic negative GI ROS, Neg liver ROS,,,  Endo/Other  diabetes, Type 2    Renal/GU negative Renal ROS  negative genitourinary   Musculoskeletal   Abdominal   Peds  Hematology negative hematology ROS (+)   Anesthesia Other Findings Past Medical History: No date: Anal fissure No date: Arthritis     Comment:  back No date: Cancer (HCC)     Comment:  melanoma removed from back 30 yrs ago No date: Epilepsy (HCC)     Comment:  last seziure years ago  No date: Hyperlipemia No date: Hypertension No date: Melanoma (HCC)     Comment:  Back x 2 per patients wife 03/18/2022: Melanoma (HCC)     Comment:  Right back infrascapula - [facility] 04/26/22 No date: Seizure (HCC)     Comment:  last seizure 30 yrs ago No date: Type 2 diabetes mellitus (HCC)     Comment:  type 2   Past Surgical History: No date: ANAL FISSURE REPAIR     Comment:  5 or 6 times 12/20/2019: LIGATION OF INTERNAL FISTULA TRACT; N/A     Comment:  Procedure: LIGATION OF INTERNAL FISTULA TRACT;  Surgeon:              Romie Levee, MD;  Location: [facility];  Service: General;  Laterality: N/A; 30 yrs ago: melanoma removed from back 04/03/2020: RECTAL EXAM UNDER ANESTHESIA; N/A     Comment:  Procedure: ANAL EXAM UNDER ANESTHESIA, incision and                drainage of perianal abcess, drain placement;  Surgeon:               Romie Levee, MD;  Location: [facility];  Service: General;  Laterality: N/A;  BMI    Body Mass Index: 40.35 kg/m      Reproductive/Obstetrics negative OB ROS                             Anesthesia Physical Anesthesia Plan  ASA: 3  Anesthesia Plan: General   Post-op Pain Management:    Induction: Intravenous  PONV Risk Score and Plan: Propofol infusion and TIVA  Airway Management Planned: Natural Airway and Nasal Cannula  Additional Equipment:   Intra-op Plan:   Post-operative Plan:   Informed Consent: I have reviewed the patients History and Physical, chart, labs and discussed the procedure including the risks, benefits and alternatives for the proposed anesthesia with the patient or authorized representative who has indicated his/her understanding and acceptance.     Dental Advisory Given  Plan Discussed with: Anesthesiologist, CRNA and Surgeon  Anesthesia Plan Comments: (Patient and wife  consented for risks of anesthesia including but not limited to:  - adverse reactions to medications - risk of airway placement if required - damage to eyes, teeth, lips or other oral mucosa - nerve damage due to positioning  - sore throat or hoarseness - Damage to heart, brain, nerves, lungs, other parts of body or loss of life  She voiced understanding.)       Anesthesia Quick Evaluation

## 2022-10-13 NOTE — Op Note (Signed)
[facility] Gastroenterology Patient Name: Daniel Hansen Procedure Date: 10/13/2022 1:52 PM MRN: [id number] Account #: [id number] Date of Birth: 1951/01/01 Admit Type: Outpatient Age: 72 Room: [facility] ENDO ROOM 2 Gender: Male Note Status: Finalized Instrument Name: Prentice Docker [id number] Procedure:             Colonoscopy Indications:           Abnormal PET scan of the GI tract Providers:             Boykin Nearing. Norma Fredrickson MD, MD Referring MD:          Duane Lope. Judithann Sheen, MD (Referring MD) Medicines:             Propofol per Anesthesia Complications:         No immediate complications. Procedure:             Pre-Anesthesia Assessment:                        - The risks and benefits of the procedure and the                         sedation options and risks were discussed with the                         patient. All questions were answered and informed                         consent was obtained.                        - Patient identification and proposed procedure were                         verified prior to the procedure by the nurse. The                         procedure was verified in the procedure room.                        - ASA Grade Assessment: III - A patient with severe                         systemic disease.                        - After reviewing the risks and benefits, the patient                         was deemed in satisfactory condition to undergo the                         procedure.                        After obtaining informed consent, the colonoscope was                         passed under direct vision. Throughout the procedure,  the patient's blood pressure, pulse, and oxygen                         saturations were monitored continuously. The                         Colonoscope was introduced through the anus and                         advanced to the the cecum, identified by appendiceal                          orifice and ileocecal valve. The patient tolerated the                         procedure well. The quality of the bowel preparation                         was adequate. The colonoscopy was somewhat difficult                         due to significant looping. Successful completion of                         the procedure was aided by applying abdominal pressure. Findings:      The perianal and digital rectal examinations were normal. Pertinent       negatives include normal sphincter tone and no palpable rectal lesions.      Non-bleeding internal hemorrhoids were found during endoscopy. The       hemorrhoids were Grade I (internal hemorrhoids that do not prolapse).      Retroflexion in the rectum was not performed due to unusual anatomy.      A 5 mm polyp was found in the cecum. The polyp was sessile. The polyp       was removed with a jumbo cold forceps. Resection and retrieval were       complete.      The exam was otherwise without abnormality. Impression:            - Non-bleeding internal hemorrhoids.                        - One 5 mm polyp in the cecum, removed with a jumbo                         cold forceps. Resected and retrieved.                        - The examination was otherwise normal. Recommendation:        - Patient has a contact number available for                         emergencies. The signs and symptoms of potential                         delayed complications were discussed with the patient.  Return to normal activities tomorrow. Written                         discharge instructions were provided to the patient.                        - Resume previous diet.                        - Continue present medications.                        - If polyps are benign or adenomatous without                         dysplasia, I will advise NO further colonoscopy due to                         advanced age and/or severe comorbidity.                         - Return to nurse practitioner at appointment to be                         scheduled.                        - Follow up with Vevelyn Pat, GI Nurse                         Practioner, in office to discuss results and monitor                         progress.                        - Telephone GI office to schedule appointment.                        - The findings and recommendations were discussed with                         the patient. Procedure Code(s):     --- Professional ---                        [phone], Colonoscopy, flexible; with biopsy, single or                         multiple Diagnosis Code(s):     --- Professional ---                        R93.3, Abnormal findings on diagnostic imaging of                         other parts of digestive tract                        K64.0, First degree hemorrhoids                        D12.0,  Benign neoplasm of cecum CPT copyright 2022 American Medical Association. All rights reserved. The codes documented in this report are preliminary and upon coder review may  be revised to meet current compliance requirements. Stanton Kidney MD, MD 10/13/2022 2:44:37 PM This report has been signed electronically. Number of Addenda: 0 Note Initiated On: 10/13/2022 1:52 PM Scope Withdrawal Time: 0 hours 8 minutes 21 seconds  Total Procedure Duration: 0 hours 15 minutes 25 seconds  Estimated Blood Loss:  Estimated blood loss: none.      [facility]

## 2022-10-13 NOTE — Transfer of Care (Signed)
Immediate Anesthesia Transfer of Care Note  Patient: Daniel Hansen  Procedure(s) Performed: COLONOSCOPY WITH PROPOFOL ESOPHAGOGASTRODUODENOSCOPY (EGD) WITH PROPOFOL  Patient Location: PACU  Anesthesia Type:General  Level of Consciousness: drowsy  Airway & Oxygen Therapy: Patient Spontanous Breathing and Patient connected to nasal cannula oxygen  Post-op Assessment: Report given to RN and Post -op Vital signs reviewed and stable  Post vital signs: Reviewed and stable  Last Vitals:  Vitals Value Taken Time  BP 109/60 10/13/22 1444  Temp 36 C 10/13/22 1444  Pulse 62 10/13/22 1444  Resp 8 10/13/22 1444  SpO2 95 % 10/13/22 1444  Vitals shown include unvalidated device data.  Last Pain:  Vitals:   10/13/22 1444  TempSrc: Temporal  PainSc: Asleep         Complications: No notable events documented.

## 2022-10-13 NOTE — Interval H&P Note (Signed)
History and Physical Interval Note:  10/13/2022 1:54 PM  Daniel Hansen  has presented today for surgery, with the diagnosis of ABNORMAL PET SCAN OF COLON.  The various methods of treatment have been discussed with the patient and family. After consideration of risks, benefits and other options for treatment, the patient has consented to  Procedure(s): COLONOSCOPY WITH PROPOFOL (N/A) ESOPHAGOGASTRODUODENOSCOPY (EGD) WITH PROPOFOL (N/A) as a surgical intervention.  The patient's history has been reviewed, patient examined, no change in status, stable for surgery.  I have reviewed the patient's chart and labs.  Questions were answered to the patient's satisfaction.     [place], [place]

## 2022-10-13 NOTE — Op Note (Signed)
[facility] Gastroenterology Patient Name: Daniel Hansen Procedure Date: 10/13/2022 1:52 PM MRN: [id number] Account #: [id number] Date of Birth: 1950-08-16 Admit Type: Outpatient Age: 72 Room: [facility] ENDO ROOM 2 Gender: Male Note Status: Finalized Instrument Name: Upper Endoscope [id number] Procedure:             Upper GI endoscopy Indications:           Abnormal PET scan of the GI tract Providers:             Boykin Nearing. Norma Fredrickson MD, MD Referring MD:          Duane Lope. Judithann Sheen, MD (Referring MD) Medicines:             Propofol per Anesthesia Complications:         No immediate complications. Procedure:             Pre-Anesthesia Assessment:                        - The risks and benefits of the procedure and the                         sedation options and risks were discussed with the                         patient. All questions were answered and informed                         consent was obtained.                        - Patient identification and proposed procedure were                         verified prior to the procedure by the nurse. The                         procedure was verified in the procedure room.                        - ASA Grade Assessment: III - A patient with severe                         systemic disease.                        - After reviewing the risks and benefits, the patient                         was deemed in satisfactory condition to undergo the                         procedure.                        After obtaining informed consent, the endoscope was                         passed under direct vision. Throughout the procedure,  the patient's blood pressure, pulse, and oxygen                         saturations were monitored continuously. The Endoscope                         was introduced through the mouth, and advanced to the                         third part of duodenum. The upper GI endoscopy was                          accomplished without difficulty. The patient tolerated                         the procedure well. Findings:      The esophagus was normal.      Localized mild inflammation characterized by erosions was found in the       prepyloric region of the stomach.      The cardia and gastric fundus were normal on retroflexion.      The examined duodenum was normal.      The exam was otherwise without abnormality. Impression:            - Normal esophagus.                        - Gastritis.                        - Normal examined duodenum.                        - The examination was otherwise normal.                        - No specimens collected. Recommendation:        - Proceed with colonoscopy Procedure Code(s):     --- Professional ---                        [phone], Esophagogastroduodenoscopy, flexible,                         transoral; diagnostic, including collection of                         specimen(s) by brushing or washing, when performed                         (separate procedure) Diagnosis Code(s):     --- Professional ---                        R93.3, Abnormal findings on diagnostic imaging of                         other parts of digestive tract                        K29.70, Gastritis, unspecified, without bleeding CPT copyright 2022 American Medical Association. All rights reserved. The codes documented in this report are  preliminary and upon coder review may  be revised to meet current compliance requirements. Stanton Kidney MD, MD 10/13/2022 2:21:33 PM This report has been signed electronically. Number of Addenda: 0 Note Initiated On: 10/13/2022 1:52 PM Estimated Blood Loss:  Estimated blood loss: none.      [facility]

## 2022-10-14 ENCOUNTER — Other Ambulatory Visit: Payer: Self-pay | Admitting: Student in an Organized Health Care Education/Training Program

## 2022-10-14 DIAGNOSIS — C4359 Malignant melanoma of other part of trunk: Secondary | ICD-10-CM

## 2022-10-14 NOTE — Anesthesia Postprocedure Evaluation (Signed)
Anesthesia Post Note  Patient: Daniel Hansen  Procedure(s) Performed: COLONOSCOPY WITH PROPOFOL ESOPHAGOGASTRODUODENOSCOPY (EGD) WITH PROPOFOL  Patient location during evaluation: PACU Anesthesia Type: General Level of consciousness: awake and alert Pain management: pain level controlled Vital Signs Assessment: post-procedure vital signs reviewed and stable Respiratory status: spontaneous breathing, nonlabored ventilation and respiratory function stable Cardiovascular status: blood pressure returned to baseline and stable Postop Assessment: no apparent nausea or vomiting Anesthetic complications: no   No notable events documented.   Last Vitals:  Vitals:   10/13/22 1444 10/13/22 1454  BP: 109/60 135/65  Pulse: 60   Resp: (!) 9   Temp: (!) 36 C   SpO2: 95%     Last Pain:  Vitals:   10/13/22 1504  TempSrc:   PainSc: 0-No pain                 Foye Deer

## 2022-10-15 ENCOUNTER — Encounter: Payer: Self-pay | Admitting: Internal Medicine

## 2022-10-15 LAB — SURGICAL PATHOLOGY

## 2022-10-20 DIAGNOSIS — Z79899 Other long term (current) drug therapy: Secondary | ICD-10-CM | POA: Diagnosis not present

## 2022-10-20 DIAGNOSIS — Z5112 Encounter for antineoplastic immunotherapy: Secondary | ICD-10-CM | POA: Diagnosis not present

## 2022-10-20 DIAGNOSIS — C4359 Malignant melanoma of other part of trunk: Secondary | ICD-10-CM | POA: Diagnosis not present

## 2022-10-20 DIAGNOSIS — Z1159 Encounter for screening for other viral diseases: Secondary | ICD-10-CM | POA: Diagnosis not present

## 2022-10-26 ENCOUNTER — Ambulatory Visit
Admission: RE | Admit: 2022-10-26 | Discharge: 2022-10-26 | Disposition: A | Discharge status: Home / Self Care | Payer: Medicare HMO | Source: Ambulatory Visit | Attending: Student in an Organized Health Care Education/Training Program | Admitting: Student in an Organized Health Care Education/Training Program

## 2022-10-26 DIAGNOSIS — C4359 Malignant melanoma of other part of trunk: Secondary | ICD-10-CM

## 2022-10-27 ENCOUNTER — Ambulatory Visit
Admission: RE | Admit: 2022-10-27 | Discharge: 2022-10-27 | Disposition: A | Discharge status: Home / Self Care | Payer: Medicare HMO | Source: Ambulatory Visit | Attending: Student in an Organized Health Care Education/Training Program | Admitting: Student in an Organized Health Care Education/Training Program

## 2022-10-27 DIAGNOSIS — R933 Abnormal findings on diagnostic imaging of other parts of digestive tract: Secondary | ICD-10-CM | POA: Diagnosis not present

## 2022-10-27 DIAGNOSIS — C4359 Malignant melanoma of other part of trunk: Secondary | ICD-10-CM | POA: Diagnosis not present

## 2022-10-27 DIAGNOSIS — R590 Localized enlarged lymph nodes: Secondary | ICD-10-CM | POA: Insufficient documentation

## 2022-10-27 DIAGNOSIS — C4490 Unspecified malignant neoplasm of skin, unspecified: Secondary | ICD-10-CM

## 2022-10-27 LAB — GLUCOSE, CAPILLARY: Glucose-Capillary: 82 mg/dL (ref 70–99)

## 2022-10-27 MED ORDER — FLUDEOXYGLUCOSE F - 18 (FDG) INJECTION
12.0000 | Freq: Once | INTRAVENOUS | Status: AC
  Administered 2022-10-27: 13.06 via INTRAVENOUS

## 2022-10-28 ENCOUNTER — Ambulatory Visit: Payer: Medicare HMO

## 2022-11-03 DIAGNOSIS — C4359 Malignant melanoma of other part of trunk: Secondary | ICD-10-CM | POA: Diagnosis not present

## 2022-11-10 DIAGNOSIS — G40909 Epilepsy, unspecified, not intractable, without status epilepticus: Secondary | ICD-10-CM | POA: Diagnosis not present

## 2022-11-10 DIAGNOSIS — C7989 Secondary malignant neoplasm of other specified sites: Secondary | ICD-10-CM | POA: Diagnosis not present

## 2022-11-10 DIAGNOSIS — Z79899 Other long term (current) drug therapy: Secondary | ICD-10-CM | POA: Diagnosis not present

## 2022-11-10 DIAGNOSIS — R21 Rash and other nonspecific skin eruption: Secondary | ICD-10-CM | POA: Diagnosis not present

## 2022-11-10 DIAGNOSIS — I10 Essential (primary) hypertension: Secondary | ICD-10-CM | POA: Diagnosis not present

## 2022-11-10 DIAGNOSIS — C4359 Malignant melanoma of other part of trunk: Secondary | ICD-10-CM | POA: Diagnosis not present

## 2022-11-10 DIAGNOSIS — Z5112 Encounter for antineoplastic immunotherapy: Secondary | ICD-10-CM | POA: Diagnosis not present

## 2022-11-10 DIAGNOSIS — Z1159 Encounter for screening for other viral diseases: Secondary | ICD-10-CM | POA: Diagnosis not present

## 2022-11-10 DIAGNOSIS — E119 Type 2 diabetes mellitus without complications: Secondary | ICD-10-CM | POA: Diagnosis not present

## 2022-11-10 DIAGNOSIS — N429 Disorder of prostate, unspecified: Secondary | ICD-10-CM | POA: Diagnosis not present

## 2022-11-10 DIAGNOSIS — E039 Hypothyroidism, unspecified: Secondary | ICD-10-CM | POA: Diagnosis not present

## 2022-11-28 ENCOUNTER — Telehealth: Payer: Self-pay | Admitting: Urology

## 2022-11-28 NOTE — Telephone Encounter (Signed)
Patient with history of abnormal prostate MRI and declined to schedule fusion biopsy.  Recommend lab visit for follow-up PSA

## 2022-11-29 NOTE — Telephone Encounter (Signed)
Appt made 12/13/2022

## 2022-12-01 DIAGNOSIS — E785 Hyperlipidemia, unspecified: Secondary | ICD-10-CM | POA: Diagnosis not present

## 2022-12-01 DIAGNOSIS — E039 Hypothyroidism, unspecified: Secondary | ICD-10-CM | POA: Diagnosis not present

## 2022-12-01 DIAGNOSIS — Z5112 Encounter for antineoplastic immunotherapy: Secondary | ICD-10-CM | POA: Diagnosis not present

## 2022-12-01 DIAGNOSIS — C4359 Malignant melanoma of other part of trunk: Secondary | ICD-10-CM | POA: Diagnosis not present

## 2022-12-01 DIAGNOSIS — I10 Essential (primary) hypertension: Secondary | ICD-10-CM | POA: Diagnosis not present

## 2022-12-01 DIAGNOSIS — C773 Secondary and unspecified malignant neoplasm of axilla and upper limb lymph nodes: Secondary | ICD-10-CM | POA: Diagnosis not present

## 2022-12-01 DIAGNOSIS — R21 Rash and other nonspecific skin eruption: Secondary | ICD-10-CM | POA: Diagnosis not present

## 2022-12-01 DIAGNOSIS — E119 Type 2 diabetes mellitus without complications: Secondary | ICD-10-CM | POA: Diagnosis not present

## 2022-12-01 DIAGNOSIS — G40909 Epilepsy, unspecified, not intractable, without status epilepticus: Secondary | ICD-10-CM | POA: Diagnosis not present

## 2022-12-01 DIAGNOSIS — L270 Generalized skin eruption due to drugs and medicaments taken internally: Secondary | ICD-10-CM | POA: Diagnosis not present

## 2022-12-01 DIAGNOSIS — Z79899 Other long term (current) drug therapy: Secondary | ICD-10-CM | POA: Diagnosis not present

## 2022-12-10 ENCOUNTER — Other Ambulatory Visit: Payer: Self-pay

## 2022-12-10 DIAGNOSIS — R972 Elevated prostate specific antigen [PSA]: Secondary | ICD-10-CM

## 2022-12-13 ENCOUNTER — Other Ambulatory Visit: Payer: Medicare HMO

## 2022-12-13 ENCOUNTER — Other Ambulatory Visit: Payer: Self-pay | Admitting: Student in an Organized Health Care Education/Training Program

## 2022-12-13 DIAGNOSIS — R972 Elevated prostate specific antigen [PSA]: Secondary | ICD-10-CM

## 2022-12-13 DIAGNOSIS — C4359 Malignant melanoma of other part of trunk: Secondary | ICD-10-CM

## 2022-12-14 DIAGNOSIS — H903 Sensorineural hearing loss, bilateral: Secondary | ICD-10-CM | POA: Diagnosis not present

## 2022-12-14 LAB — PSA: Prostate Specific Ag, Serum: 1.6 ng/mL (ref 0.0–4.0)

## 2022-12-22 DIAGNOSIS — Z79899 Other long term (current) drug therapy: Secondary | ICD-10-CM | POA: Diagnosis not present

## 2022-12-22 DIAGNOSIS — G40909 Epilepsy, unspecified, not intractable, without status epilepticus: Secondary | ICD-10-CM | POA: Diagnosis not present

## 2022-12-22 DIAGNOSIS — I10 Essential (primary) hypertension: Secondary | ICD-10-CM | POA: Diagnosis not present

## 2022-12-22 DIAGNOSIS — E785 Hyperlipidemia, unspecified: Secondary | ICD-10-CM | POA: Diagnosis not present

## 2022-12-22 DIAGNOSIS — R21 Rash and other nonspecific skin eruption: Secondary | ICD-10-CM | POA: Diagnosis not present

## 2022-12-22 DIAGNOSIS — E039 Hypothyroidism, unspecified: Secondary | ICD-10-CM | POA: Diagnosis not present

## 2022-12-22 DIAGNOSIS — R3989 Other symptoms and signs involving the genitourinary system: Secondary | ICD-10-CM | POA: Diagnosis not present

## 2022-12-22 DIAGNOSIS — Z5112 Encounter for antineoplastic immunotherapy: Secondary | ICD-10-CM | POA: Diagnosis not present

## 2022-12-22 DIAGNOSIS — E119 Type 2 diabetes mellitus without complications: Secondary | ICD-10-CM | POA: Diagnosis not present

## 2022-12-22 DIAGNOSIS — C4359 Malignant melanoma of other part of trunk: Secondary | ICD-10-CM | POA: Diagnosis not present

## 2022-12-22 DIAGNOSIS — Z1159 Encounter for screening for other viral diseases: Secondary | ICD-10-CM | POA: Diagnosis not present

## 2022-12-28 ENCOUNTER — Ambulatory Visit
Admission: RE | Admit: 2022-12-28 | Discharge: 2022-12-28 | Disposition: A | Discharge status: Home / Self Care | Payer: Medicare HMO | Source: Ambulatory Visit | Attending: Student in an Organized Health Care Education/Training Program | Admitting: Student in an Organized Health Care Education/Training Program

## 2022-12-28 DIAGNOSIS — K449 Diaphragmatic hernia without obstruction or gangrene: Secondary | ICD-10-CM | POA: Insufficient documentation

## 2022-12-28 DIAGNOSIS — C4359 Malignant melanoma of other part of trunk: Secondary | ICD-10-CM | POA: Insufficient documentation

## 2022-12-28 DIAGNOSIS — I2510 Atherosclerotic heart disease of native coronary artery without angina pectoris: Secondary | ICD-10-CM | POA: Insufficient documentation

## 2022-12-28 DIAGNOSIS — C773 Secondary and unspecified malignant neoplasm of axilla and upper limb lymph nodes: Secondary | ICD-10-CM | POA: Insufficient documentation

## 2022-12-28 DIAGNOSIS — I700 Atherosclerosis of aorta: Secondary | ICD-10-CM | POA: Diagnosis not present

## 2022-12-28 DIAGNOSIS — C779 Secondary and unspecified malignant neoplasm of lymph node, unspecified: Secondary | ICD-10-CM | POA: Diagnosis not present

## 2022-12-28 DIAGNOSIS — C439 Malignant melanoma of skin, unspecified: Secondary | ICD-10-CM | POA: Diagnosis not present

## 2022-12-28 LAB — GLUCOSE, CAPILLARY: Glucose-Capillary: 92 mg/dL (ref 70–99)

## 2022-12-28 MED ORDER — FLUDEOXYGLUCOSE F - 18 (FDG) INJECTION
12.0000 | Freq: Once | INTRAVENOUS | Status: AC | PRN
  Administered 2022-12-28: 12.17 via INTRAVENOUS

## 2023-01-12 DIAGNOSIS — C4359 Malignant melanoma of other part of trunk: Secondary | ICD-10-CM | POA: Diagnosis not present

## 2023-01-13 ENCOUNTER — Ambulatory Visit: Payer: Medicare HMO | Admitting: Dermatology

## 2023-02-04 DIAGNOSIS — C4359 Malignant melanoma of other part of trunk: Secondary | ICD-10-CM | POA: Diagnosis not present

## 2023-02-04 DIAGNOSIS — C773 Secondary and unspecified malignant neoplasm of axilla and upper limb lymph nodes: Secondary | ICD-10-CM | POA: Diagnosis not present

## 2023-02-04 DIAGNOSIS — G40909 Epilepsy, unspecified, not intractable, without status epilepticus: Secondary | ICD-10-CM | POA: Diagnosis not present

## 2023-02-04 DIAGNOSIS — I10 Essential (primary) hypertension: Secondary | ICD-10-CM | POA: Diagnosis not present

## 2023-02-04 DIAGNOSIS — Z880 Allergy status to penicillin: Secondary | ICD-10-CM | POA: Diagnosis not present

## 2023-02-04 DIAGNOSIS — E785 Hyperlipidemia, unspecified: Secondary | ICD-10-CM | POA: Diagnosis not present

## 2023-02-04 DIAGNOSIS — Z7984 Long term (current) use of oral hypoglycemic drugs: Secondary | ICD-10-CM | POA: Diagnosis not present

## 2023-02-04 DIAGNOSIS — E119 Type 2 diabetes mellitus without complications: Secondary | ICD-10-CM | POA: Diagnosis not present

## 2023-02-04 DIAGNOSIS — G8918 Other acute postprocedural pain: Secondary | ICD-10-CM | POA: Diagnosis not present

## 2023-02-04 DIAGNOSIS — Z79890 Hormone replacement therapy (postmenopausal): Secondary | ICD-10-CM | POA: Diagnosis not present

## 2023-02-04 DIAGNOSIS — C4361 Malignant melanoma of right upper limb, including shoulder: Secondary | ICD-10-CM | POA: Diagnosis not present

## 2023-02-04 DIAGNOSIS — C439 Malignant melanoma of skin, unspecified: Secondary | ICD-10-CM | POA: Diagnosis not present

## 2023-02-11 DIAGNOSIS — Z6841 Body Mass Index (BMI) 40.0 and over, adult: Secondary | ICD-10-CM | POA: Diagnosis not present

## 2023-02-11 DIAGNOSIS — I10 Essential (primary) hypertension: Secondary | ICD-10-CM | POA: Diagnosis not present

## 2023-02-11 DIAGNOSIS — G40909 Epilepsy, unspecified, not intractable, without status epilepticus: Secondary | ICD-10-CM | POA: Diagnosis not present

## 2023-02-11 DIAGNOSIS — E039 Hypothyroidism, unspecified: Secondary | ICD-10-CM | POA: Diagnosis not present

## 2023-02-11 DIAGNOSIS — Z79899 Other long term (current) drug therapy: Secondary | ICD-10-CM | POA: Diagnosis not present

## 2023-02-11 DIAGNOSIS — E7800 Pure hypercholesterolemia, unspecified: Secondary | ICD-10-CM | POA: Diagnosis not present

## 2023-02-11 DIAGNOSIS — E119 Type 2 diabetes mellitus without complications: Secondary | ICD-10-CM | POA: Diagnosis not present

## 2023-02-11 DIAGNOSIS — E118 Type 2 diabetes mellitus with unspecified complications: Secondary | ICD-10-CM | POA: Diagnosis not present

## 2023-02-27 ENCOUNTER — Telehealth: Payer: Self-pay | Admitting: Urology

## 2023-02-27 NOTE — Telephone Encounter (Signed)
Please schedule follow-up visit October 2024 with PSA prior

## 2023-03-31 ENCOUNTER — Other Ambulatory Visit: Payer: Self-pay | Admitting: *Deleted

## 2023-03-31 DIAGNOSIS — R972 Elevated prostate specific antigen [PSA]: Secondary | ICD-10-CM

## 2023-04-01 ENCOUNTER — Other Ambulatory Visit: Payer: Medicare HMO

## 2023-04-01 DIAGNOSIS — R972 Elevated prostate specific antigen [PSA]: Secondary | ICD-10-CM

## 2023-04-02 LAB — PSA: Prostate Specific Ag, Serum: 1.4 ng/mL (ref 0.0–4.0)

## 2023-04-08 ENCOUNTER — Encounter: Payer: Self-pay | Admitting: Urology

## 2023-04-08 ENCOUNTER — Ambulatory Visit: Payer: Medicare HMO | Admitting: Urology

## 2023-04-08 VITALS — BP 127/53 | HR 54 | Ht 66.0 in | Wt 255.0 lb

## 2023-04-08 DIAGNOSIS — R9389 Abnormal findings on diagnostic imaging of other specified body structures: Secondary | ICD-10-CM | POA: Diagnosis not present

## 2023-04-08 DIAGNOSIS — R972 Elevated prostate specific antigen [PSA]: Secondary | ICD-10-CM

## 2023-04-08 NOTE — Progress Notes (Signed)
I, Maysun Anabel Bene, acting as a scribe for Riki Altes, MD., have documented all relevant documentation on the behalf of Riki Altes, MD, as directed by Riki Altes, MD while in the presence of Riki Altes, MD.  04/08/2023 3:25 PM   Len Blalock Sharyne Peach June 08, 1951 [id number]  Referring provider: Marguarite Arbour, MD [address] [facility] [place],  [place] [address]  Chief Complaint  Patient presents with   Elevated PSA   Urologic history: 1. Abnormal prostate MRI On an FDG PET/CT incidentally noted to have intense uptake in the apex of the prostate R >L  HPI: Daniel Hansen is a 72 y.o. male presents for a follow-up visit. He is accompanied by his wife.   Initially seen December 2023 due to increased uptake in the prostatic apex FDG PET-CT. His PSA in 2022 was normal. PSA was obtained which was 1.6, and a prostate ultrasound was ordered which showed a 1.9 cm right lateral mid-gland PI-RADS 4 lesion in the transition zone felt suspicious for high-grade carcinoma. Fusion biopsy was recommended, however, he declined to schedule and follow-up visit with PSA was then recommended.  He has no complaints today. A repeat PSA 04/01/23 remains normal and slightly lower at 1.4  PSA trend   Prostate Specific Ag, Serum  Latest Ref Rng 0.0 - 4.0 ng/mL  06/03/2022 1.7   12/13/2022 1.6   04/01/2023 1.4      PMH: Past Medical History:  Diagnosis Date   Anal fissure    Arthritis    back   Cancer (HCC)    melanoma removed from back 30 yrs ago   Epilepsy (HCC)    last seziure years ago    Hyperlipemia    Hypertension    Melanoma (HCC)    Back x 2 per patients wife   Melanoma (HCC) 03/18/2022   Right back infrascapula - [facility] 04/26/22   Seizure (HCC)    last seizure 30 yrs ago   Type 2 diabetes mellitus (HCC)    type 2     Surgical History: Past Surgical History:  Procedure Laterality Date   ANAL FISSURE REPAIR     5 or 6 times   COLONOSCOPY WITH  PROPOFOL N/A 10/13/2022   Procedure: COLONOSCOPY WITH PROPOFOL;  Surgeon: Toledo, Boykin Nearing, MD;  Location: [facility] ENDOSCOPY;  Service: Endoscopy;  Laterality: N/A;   ESOPHAGOGASTRODUODENOSCOPY (EGD) WITH PROPOFOL N/A 10/13/2022   Procedure: ESOPHAGOGASTRODUODENOSCOPY (EGD) WITH PROPOFOL;  Surgeon: Toledo, Boykin Nearing, MD;  Location: [facility] ENDOSCOPY;  Service: Endoscopy;  Laterality: N/A;   LIGATION OF INTERNAL FISTULA TRACT N/A 12/20/2019   Procedure: LIGATION OF INTERNAL FISTULA TRACT;  Surgeon: Romie Levee, MD;  Location: [facility];  Service: General;  Laterality: N/A;   melanoma removed from back  30 yrs ago   RECTAL EXAM UNDER ANESTHESIA N/A 04/03/2020   Procedure: ANAL EXAM UNDER ANESTHESIA, incision and drainage of perianal abcess, drain placement;  Surgeon: Romie Levee, MD;  Location: [facility];  Service: General;  Laterality: N/A;    Home Medications:  Allergies as of 04/08/2023       Reactions   Penicillins Hives        Medication List        Accurate as of April 08, 2023  3:25 PM. If you have any questions, ask your nurse or doctor.          atorvastatin 80 MG tablet Commonly known as: LIPITOR daily.  atorvastatin 80 MG tablet Commonly known as: LIPITOR Take 1 tablet by mouth daily.   azithromycin 250 MG tablet Commonly known as: ZITHROMAX   benzonatate 200 MG capsule Commonly known as: TESSALON Take by mouth.   diphenoxylate-atropine 2.5-0.025 MG tablet Commonly known as: LOMOTIL As needed for diarrhea   glimepiride 4 MG tablet Commonly known as: AMARYL Take 4 mg by mouth daily with breakfast.   levothyroxine 50 MCG tablet Commonly known as: SYNTHROID Take by mouth.   loperamide 2 MG capsule Commonly known as: IMODIUM   losartan-hydrochlorothiazide 100-25 MG tablet Commonly known as: HYZAAR daily.   meclizine 25 MG tablet Commonly known as: ANTIVERT Take 25 mg by mouth 3 (three) times daily as needed for  dizziness.   oxyCODONE 5 MG immediate release tablet Commonly known as: Oxy IR/ROXICODONE Take 1 tablet (5 mg total) by mouth every 6 (six) hours as needed (for pain).   PHENobarbital 32.4 MG tablet Commonly known as: LUMINAL daily. Takes 4 in am   phenytoin 100 MG ER capsule Commonly known as: DILANTIN daily. Takes 4 of 100 mg in am        Allergies:  Allergies  Allergen Reactions   Penicillins Hives    Social History:  reports that he has never smoked. He has never used smokeless tobacco. He reports that he does not drink alcohol and does not use drugs.   Physical Exam: BP (!) 127/53   Pulse (!) 54   Ht 5\' 6"  (1.676 m)   Wt 255 lb (115.7 kg)   BMI 41.16 kg/m   Constitutional:  Alert and oriented, No acute distress. HEENT: [place] AT, moist mucus membranes.  Trachea midline, no masses. Cardiovascular: No clubbing, cyanosis, or edema. Respiratory: Normal respiratory effort, no increased work of breathing. GI: Abdomen is soft, nontender, nondistended, no abdominal masses Skin: No rashes, bruises or suspicious lesions. Neurologic: Grossly intact, no focal deficits, moving all 4 extremities. Psychiatric: Normal mood and affect.   Pertinent Imaging: MR was personally reviewed and interpreted.   EXAM: MR PROSTATE WITHOUT AND WITH CONTRAST   TECHNIQUE: Multiplanar multisequence MRI images were obtained of the pelvis centered about the prostate. Pre and post contrast images were obtained.   CONTRAST:  10mL GADAVIST GADOBUTROL 1 MMOL/ML IV SOLN   COMPARISON:  None Available.   FINDINGS: Prostate:   -- Peripheral Zone: No focal lesion seen on ADC or high b-value DWI sequences.   -- Transition/Central Zone: Mildly enlarged with small BPH nodules noted. A focal area of heterogeneous T2 hypointensity is seen in the right lateral mid gland, which has obscured margins. This measures 1.9 by 1.0 cm on image 38/10. This shows marked ADC hypointensity and DWI  hyperintensity. (PI-RADS 4)   -- Measurements/Volume:  4.3 by 3.9 x 4.7 cm (volume = 41 cm^3)   Transcapsular spread:  Absent   Seminal vesicle involvement:  Absent   Neurovascular bundle involvement:  Absent   Pelvic adenopathy: None visualized   Bone metastasis: None visualized   Other:  None   IMPRESSION: 1.9 cm transition zone lesion in the right lateral mid gland, suspicious for high-grade carcinoma. PI-RADS 4 (v2.1): High (clinically significant cancer likely).   No evidence of extracapsular extension or pelvic metastatic disease.   (I have post-processed this exam in the DynaCAD application for potential fusion-guided biopsy.)     Electronically Signed   By: Danae Orleans M.D.   On: 07/05/2022 12:01   Assessment & Plan:    1. Abnormal prostate MRI  We discussed the MRI findings and that PI-RADS 4 lesions are suspicious for high-grade prostate cancer. We discussed the only way to diagnose would be a MR fusion biopsy. The procedure was discussed in detail.  He has declined to schedule biopsy, and we discussed the potential for delay in diagnosis of prostate cancer with possible metastasis and he indicated he was willing to accept this risk.  He is agreeable with following his PSA and will schedule a follow-up PSA with [facility] in 6 months.  [facility] [address] [place], [place] [address] [phone]

## 2023-04-14 ENCOUNTER — Ambulatory Visit (INDEPENDENT_AMBULATORY_CARE_PROVIDER_SITE_OTHER): Payer: Medicare HMO | Admitting: Podiatry

## 2023-04-14 ENCOUNTER — Encounter: Payer: Self-pay | Admitting: Podiatry

## 2023-04-14 ENCOUNTER — Ambulatory Visit (INDEPENDENT_AMBULATORY_CARE_PROVIDER_SITE_OTHER): Payer: Medicare HMO

## 2023-04-14 VITALS — Ht 66.0 in | Wt 255.0 lb

## 2023-04-14 DIAGNOSIS — M2041 Other hammer toe(s) (acquired), right foot: Secondary | ICD-10-CM | POA: Diagnosis not present

## 2023-04-14 DIAGNOSIS — M778 Other enthesopathies, not elsewhere classified: Secondary | ICD-10-CM | POA: Diagnosis not present

## 2023-04-14 DIAGNOSIS — E119 Type 2 diabetes mellitus without complications: Secondary | ICD-10-CM

## 2023-04-14 DIAGNOSIS — M2042 Other hammer toe(s) (acquired), left foot: Secondary | ICD-10-CM | POA: Diagnosis not present

## 2023-04-14 DIAGNOSIS — Q828 Other specified congenital malformations of skin: Secondary | ICD-10-CM | POA: Diagnosis not present

## 2023-04-14 NOTE — Progress Notes (Signed)
Subjective:  Patient ID: Daniel Hansen, male    DOB: [birth year],  MRN: [id number]  Chief Complaint  Patient presents with   Foot Pain    Patient is here for right foot pain bottom of foot    72 y.o. male presents with the above complaint.  Patient presents with complaint of right submetatarsal 5 porokeratotic lesion painful to touch is progressive and worse worse with ambulation worse with pressure like for me debride down.  He would also like to get diabetic shoes he is a diabetic with last A1c of 5.6.  Denies any other acute complaints.   Review of Systems: Negative except as noted in the HPI. Denies N/V/F/Ch.  Past Medical History:  Diagnosis Date   Anal fissure    Arthritis    back   Cancer (HCC)    melanoma removed from back 30 yrs ago   Epilepsy (HCC)    last seziure years ago    Hyperlipemia    Hypertension    Melanoma (HCC)    Back x 2 per patients wife   Melanoma (HCC) 03/18/2022   Right back infrascapula - [facility] 04/26/22   Seizure (HCC)    last seizure 30 yrs ago   Type 2 diabetes mellitus (HCC)    type 2     Current Outpatient Medications:    atorvastatin (LIPITOR) 80 MG tablet, daily. , Disp: , Rfl:    atorvastatin (LIPITOR) 80 MG tablet, Take 1 tablet by mouth daily., Disp: , Rfl:    azithromycin (ZITHROMAX) 250 MG tablet, , Disp: , Rfl:    diphenoxylate-atropine (LOMOTIL) 2.5-0.025 MG tablet, As needed for diarrhea, Disp: , Rfl:    glimepiride (AMARYL) 4 MG tablet, Take 4 mg by mouth daily with breakfast., Disp: , Rfl:    loperamide (IMODIUM) 2 MG capsule, , Disp: , Rfl:    losartan-hydrochlorothiazide (HYZAAR) 100-25 MG tablet, daily. , Disp: , Rfl:    meclizine (ANTIVERT) 25 MG tablet, Take 25 mg by mouth 3 (three) times daily as needed for dizziness., Disp: , Rfl:    PHENobarbital (LUMINAL) 32.4 MG tablet, daily. Takes 4 in am, Disp: , Rfl:    phenytoin (DILANTIN) 100 MG ER capsule, daily. Takes 4 of 100 mg in am, Disp: , Rfl:   Social History   Tobacco  Use  Smoking Status Never  Smokeless Tobacco Never    Allergies  Allergen Reactions   Penicillins Hives   Objective:  There were no vitals filed for this visit. Body mass index is 41.16 kg/m. Constitutional Well developed. Well nourished.  Vascular Dorsalis pedis pulses palpable bilaterally. Posterior tibial pulses palpable bilaterally. Capillary refill normal to all digits.  No cyanosis or clubbing noted. Pedal hair growth normal.  Neurologic Normal speech. Oriented to person, place, and time. Epicritic sensation to light touch grossly present bilaterally.  Dermatologic Right submetatarsal 5 porokeratotic lesion with central nucleated core noted.  Hammertoe contracture semiflexible in nature noted to both feet.  No ulceration or open wounds noted  Orthopedic: Normal joint ROM without pain or crepitus bilaterally. No visible deformities. No bony tenderness.   Radiographs: None Assessment:   1. Porokeratosis   2. Type 2 diabetes mellitus without complication, without long-term current use of insulin (HCC)   3. Hammertoe, bilateral    Plan:  Patient was evaluated and treated and all questions answered.  Right submet 5 porokeratosis -All questions and concerns were discussed with the patient in extensive detail given the amount of pain that he is  having he will benefit from debridement of the lesion using chisel blade and a handle of the lesion was debrided down to healthy striated tissue.  No complication noted no pinpoint bleeding noted -Shoe gear modification discussed  Hammertoe bilateral with underlying diabetes -I discussed with the patient that he will benefit from diabetic shoes given is a diabetic with contractures of his toes.  No follow-ups on file.

## 2023-04-25 ENCOUNTER — Other Ambulatory Visit: Payer: Self-pay | Admitting: Student in an Organized Health Care Education/Training Program

## 2023-04-25 DIAGNOSIS — C4359 Malignant melanoma of other part of trunk: Secondary | ICD-10-CM

## 2023-05-02 ENCOUNTER — Ambulatory Visit
Admission: RE | Admit: 2023-05-02 | Discharge: 2023-05-02 | Disposition: A | Discharge status: Home / Self Care | Payer: Medicare HMO | Source: Ambulatory Visit | Attending: Student in an Organized Health Care Education/Training Program | Admitting: Student in an Organized Health Care Education/Training Program

## 2023-05-02 DIAGNOSIS — C4359 Malignant melanoma of other part of trunk: Secondary | ICD-10-CM

## 2023-05-02 LAB — GLUCOSE, CAPILLARY: Glucose-Capillary: 80 mg/dL (ref 70–99)

## 2023-05-02 MED ORDER — FLUDEOXYGLUCOSE F - 18 (FDG) INJECTION: 12.0000 | Freq: Once | INTRAVENOUS | Status: DC | PRN

## 2023-05-06 ENCOUNTER — Ambulatory Visit: Admission: RE | Admit: 2023-05-06 | Payer: Medicare HMO | Source: Ambulatory Visit

## 2023-05-10 ENCOUNTER — Ambulatory Visit
Admission: RE | Admit: 2023-05-10 | Discharge: 2023-05-10 | Disposition: A | Discharge status: Home / Self Care | Payer: Medicare HMO | Source: Ambulatory Visit | Attending: Student in an Organized Health Care Education/Training Program | Admitting: Student in an Organized Health Care Education/Training Program

## 2023-05-10 DIAGNOSIS — C4490 Unspecified malignant neoplasm of skin, unspecified: Secondary | ICD-10-CM

## 2023-05-10 DIAGNOSIS — K449 Diaphragmatic hernia without obstruction or gangrene: Secondary | ICD-10-CM | POA: Insufficient documentation

## 2023-05-10 DIAGNOSIS — I2510 Atherosclerotic heart disease of native coronary artery without angina pectoris: Secondary | ICD-10-CM | POA: Insufficient documentation

## 2023-05-10 DIAGNOSIS — C4359 Malignant melanoma of other part of trunk: Secondary | ICD-10-CM | POA: Diagnosis present

## 2023-05-10 DIAGNOSIS — R599 Enlarged lymph nodes, unspecified: Secondary | ICD-10-CM | POA: Diagnosis not present

## 2023-05-10 DIAGNOSIS — K603 Anal fistula, unspecified: Secondary | ICD-10-CM | POA: Insufficient documentation

## 2023-05-10 DIAGNOSIS — N62 Hypertrophy of breast: Secondary | ICD-10-CM | POA: Insufficient documentation

## 2023-05-10 DIAGNOSIS — I700 Atherosclerosis of aorta: Secondary | ICD-10-CM | POA: Insufficient documentation

## 2023-05-10 LAB — GLUCOSE, CAPILLARY: Glucose-Capillary: 135 mg/dL — ABNORMAL HIGH (ref 70–99)

## 2023-05-10 MED ORDER — FLUDEOXYGLUCOSE F - 18 (FDG) INJECTION
12.6300 | Freq: Once | INTRAVENOUS | Status: AC | PRN
  Administered 2023-05-10: 12.63 via INTRAVENOUS

## 2023-05-19 ENCOUNTER — Ambulatory Visit: Payer: Medicare HMO | Admitting: Dermatology

## 2023-05-27 ENCOUNTER — Other Ambulatory Visit: Payer: Medicare HMO

## 2023-09-21 DIAGNOSIS — C4359 Malignant melanoma of other part of trunk: Secondary | ICD-10-CM | POA: Diagnosis not present

## 2023-10-03 DIAGNOSIS — H60331 Swimmer's ear, right ear: Secondary | ICD-10-CM | POA: Diagnosis not present

## 2023-10-03 DIAGNOSIS — H903 Sensorineural hearing loss, bilateral: Secondary | ICD-10-CM | POA: Diagnosis not present

## 2023-10-07 ENCOUNTER — Other Ambulatory Visit: Payer: Self-pay

## 2023-10-07 ENCOUNTER — Other Ambulatory Visit

## 2023-10-07 DIAGNOSIS — R9389 Abnormal findings on diagnostic imaging of other specified body structures: Secondary | ICD-10-CM

## 2023-10-08 LAB — PSA: Prostate Specific Ag, Serum: 1.6 ng/mL (ref 0.0–4.0)

## 2023-10-12 ENCOUNTER — Ambulatory Visit (INDEPENDENT_AMBULATORY_CARE_PROVIDER_SITE_OTHER): Payer: Self-pay | Admitting: Urology

## 2023-10-12 VITALS — BP 108/66 | HR 72 | Ht 66.0 in | Wt 251.0 lb

## 2023-10-12 DIAGNOSIS — R935 Abnormal findings on diagnostic imaging of other abdominal regions, including retroperitoneum: Secondary | ICD-10-CM

## 2023-10-12 DIAGNOSIS — R9389 Abnormal findings on diagnostic imaging of other specified body structures: Secondary | ICD-10-CM

## 2023-10-12 DIAGNOSIS — R972 Elevated prostate specific antigen [PSA]: Secondary | ICD-10-CM

## 2023-10-12 NOTE — Progress Notes (Signed)
 I, Maysun Jamey Mccallum, acting as a scribe for Geraline Knapp, MD., have documented all relevant documentation on the behalf of Geraline Knapp, MD, as directed by Geraline Knapp, MD while in the presence of Geraline Knapp, MD.  10/12/2023 4:34 PM   Daniel Hansen 1950-11-09 [id number]  Referring provider: Yehuda Helms, MD [address] [facility] [place],  [place] [address]  Chief Complaint  Patient presents with   Elevated PSA   Urologic history: 1. Abnormal prostate MRI -On an FDG PET/CT incidentally noted to have intense uptake in the apex of the prostate R >L -Prostate MRI January 2024 remarkable for a 41-cc gland and a 1.9 cm transition zone lesion right midgland classified as a PI-RADS 4 lesion.  -MR Fusion biopsy recommended, which patient refused and elected continued PSA monitoring.   HPI: Daniel Hansen is a 73 y.o. male presents for 6 month follow-up.  No complaints since his last visit No bothersome lower urinary tract symptoms.  PSA 10/07/23 stable at 1.6  PSA trend     Prostate Specific Ag, Serum  Latest Ref Rng 0.0 - 4.0 ng/mL  06/03/2022 1.7   12/13/2022 1.6   04/01/2023 1.4   10/07/2023 1.6      PMH: Past Medical History:  Diagnosis Date   Anal fissure    Arthritis    back   Cancer (HCC)    melanoma removed from back 30 yrs ago   Epilepsy (HCC)    last seziure years ago    Hyperlipemia    Hypertension    Melanoma (HCC)    Back x 2 per patients wife   Melanoma (HCC) 03/18/2022   Right back infrascapula - [facility] 04/26/22   Seizure (HCC)    last seizure 30 yrs ago   Type 2 diabetes mellitus (HCC)    type 2     Surgical History: Past Surgical History:  Procedure Laterality Date   ANAL FISSURE REPAIR     5 or 6 times   COLONOSCOPY WITH PROPOFOL  N/A 10/13/2022   Procedure: COLONOSCOPY WITH PROPOFOL ;  Surgeon: Toledo, Alphonsus Jeans, MD;  Location: [facility];  Service: Endoscopy;  Laterality: N/A;   ESOPHAGOGASTRODUODENOSCOPY  (EGD) WITH PROPOFOL  N/A 10/13/2022   Procedure: ESOPHAGOGASTRODUODENOSCOPY (EGD) WITH PROPOFOL ;  Surgeon: Toledo, Alphonsus Jeans, MD;  Location: [facility];  Service: Endoscopy;  Laterality: N/A;   LIGATION OF INTERNAL FISTULA TRACT N/A 12/20/2019   Procedure: LIGATION OF INTERNAL FISTULA TRACT;  Surgeon: Joyce Nixon, MD;  Location: [facility];  Service: General;  Laterality: N/A;   melanoma removed from back  30 yrs ago   RECTAL EXAM UNDER ANESTHESIA N/A 04/03/2020   Procedure: ANAL EXAM UNDER ANESTHESIA, incision and drainage of perianal abcess, drain placement;  Surgeon: Joyce Nixon, MD;  Location: [facility];  Service: General;  Laterality: N/A;    Home Medications:  Allergies as of 10/12/2023       Reactions   Penicillins Hives        Medication List        Accurate as of October 12, 2023  4:34 PM. If you have any questions, ask your nurse or doctor.          atorvastatin 80 MG tablet Commonly known as: LIPITOR daily.   atorvastatin 80 MG tablet Commonly known as: LIPITOR Take 1 tablet by mouth daily.   azithromycin 250 MG tablet Commonly known as: ZITHROMAX   diphenoxylate-atropine 2.5-0.025 MG  tablet Commonly known as: LOMOTIL As needed for diarrhea   glimepiride 4 MG tablet Commonly known as: AMARYL Take 4 mg by mouth daily with breakfast.   hydrOXYzine 25 MG tablet Commonly known as: ATARAX Take 25 mg by mouth 3 (three) times daily as needed.   levothyroxine 50 MCG tablet Commonly known as: SYNTHROID Take 50 mcg by mouth daily.   loperamide 2 MG capsule Commonly known as: IMODIUM   losartan-hydrochlorothiazide 100-25 MG tablet Commonly known as: HYZAAR daily.   meclizine 25 MG tablet Commonly known as: ANTIVERT Take 25 mg by mouth 3 (three) times daily as needed for dizziness.   PHENobarbital  32.4 MG tablet Commonly known as: LUMINAL daily. Takes 4 in am   phenytoin  100 MG ER capsule Commonly known as:  DILANTIN  daily. Takes 4 of 100 mg in am        Allergies:  Allergies  Allergen Reactions   Penicillins Hives    Social History:  reports that he has never smoked. He has never used smokeless tobacco. He reports that he does not drink alcohol and does not use drugs.   Physical Exam: BP 108/66   Pulse 72   Ht 5\' 6"  (1.676 m)   Wt 251 lb (113.9 kg)   BMI 40.51 kg/m   Constitutional:  Alert and oriented, No acute distress. HEENT: [place] AT Respiratory: Normal respiratory effort, no increased work of breathing. GU: DRE refused. Psychiatric: Normal mood and affect.   Assessment & Plan:    1. Abnormal prostate MRI PSA normal and stable MRI was performed after a PET scan showed abnormal activity in the prostate. Recent follow-up PET scans showed no abnormal activity.  Patient has refused prostate biopsy and discussed repeating his MRI for reassessment, however he has elected to continue PSA checks.  We discussed the possibility of high-grade prostate cancer with a stable PSA and that biopsy is the only way to adequately assess Lab visit for PSA scheduled 6 months Continuing a 1 year follow-up office visit.  I have reviewed the above documentation for accuracy and completeness, and I agree with the above.   Geraline Knapp, MD  [facility] [address] [place], [place] [address] [phone]

## 2023-10-14 ENCOUNTER — Encounter: Payer: Self-pay | Admitting: Urology

## 2023-10-20 DIAGNOSIS — R569 Unspecified convulsions: Secondary | ICD-10-CM | POA: Diagnosis not present

## 2023-10-20 DIAGNOSIS — Z79899 Other long term (current) drug therapy: Secondary | ICD-10-CM | POA: Diagnosis not present

## 2023-10-20 DIAGNOSIS — E039 Hypothyroidism, unspecified: Secondary | ICD-10-CM | POA: Diagnosis not present

## 2023-10-20 DIAGNOSIS — E119 Type 2 diabetes mellitus without complications: Secondary | ICD-10-CM | POA: Diagnosis not present

## 2023-10-20 DIAGNOSIS — I10 Essential (primary) hypertension: Secondary | ICD-10-CM | POA: Diagnosis not present

## 2023-10-20 DIAGNOSIS — E782 Mixed hyperlipidemia: Secondary | ICD-10-CM | POA: Diagnosis not present

## 2023-10-20 DIAGNOSIS — R21 Rash and other nonspecific skin eruption: Secondary | ICD-10-CM | POA: Diagnosis not present

## 2023-10-26 ENCOUNTER — Other Ambulatory Visit: Payer: Self-pay | Admitting: Student in an Organized Health Care Education/Training Program

## 2023-10-26 DIAGNOSIS — C4359 Malignant melanoma of other part of trunk: Secondary | ICD-10-CM

## 2023-10-28 DIAGNOSIS — Z03818 Encounter for observation for suspected exposure to other biological agents ruled out: Secondary | ICD-10-CM | POA: Diagnosis not present

## 2023-11-04 ENCOUNTER — Ambulatory Visit: Admission: RE | Admit: 2023-11-04 | Source: Ambulatory Visit

## 2023-11-04 DIAGNOSIS — R531 Weakness: Secondary | ICD-10-CM | POA: Diagnosis not present

## 2023-11-04 DIAGNOSIS — M545 Low back pain, unspecified: Secondary | ICD-10-CM | POA: Diagnosis not present

## 2023-11-04 DIAGNOSIS — R32 Unspecified urinary incontinence: Secondary | ICD-10-CM | POA: Diagnosis not present

## 2024-02-02 DIAGNOSIS — L570 Actinic keratosis: Secondary | ICD-10-CM | POA: Diagnosis not present

## 2024-02-02 DIAGNOSIS — L853 Xerosis cutis: Secondary | ICD-10-CM | POA: Diagnosis not present

## 2024-02-02 DIAGNOSIS — L309 Dermatitis, unspecified: Secondary | ICD-10-CM | POA: Diagnosis not present

## 2024-03-02 DIAGNOSIS — I10 Essential (primary) hypertension: Secondary | ICD-10-CM | POA: Diagnosis not present

## 2024-03-02 DIAGNOSIS — E782 Mixed hyperlipidemia: Secondary | ICD-10-CM | POA: Diagnosis not present

## 2024-03-02 DIAGNOSIS — Z79899 Other long term (current) drug therapy: Secondary | ICD-10-CM | POA: Diagnosis not present

## 2024-03-02 DIAGNOSIS — Z6841 Body Mass Index (BMI) 40.0 and over, adult: Secondary | ICD-10-CM | POA: Diagnosis not present

## 2024-03-02 DIAGNOSIS — E119 Type 2 diabetes mellitus without complications: Secondary | ICD-10-CM | POA: Diagnosis not present

## 2024-03-02 DIAGNOSIS — G40909 Epilepsy, unspecified, not intractable, without status epilepticus: Secondary | ICD-10-CM | POA: Diagnosis not present

## 2024-03-02 DIAGNOSIS — E039 Hypothyroidism, unspecified: Secondary | ICD-10-CM | POA: Diagnosis not present

## 2024-04-12 ENCOUNTER — Other Ambulatory Visit

## 2024-04-13 DIAGNOSIS — R32 Unspecified urinary incontinence: Secondary | ICD-10-CM | POA: Diagnosis not present

## 2024-04-13 DIAGNOSIS — E039 Hypothyroidism, unspecified: Secondary | ICD-10-CM | POA: Diagnosis not present

## 2024-05-16 DIAGNOSIS — L308 Other specified dermatitis: Secondary | ICD-10-CM | POA: Diagnosis not present

## 2024-10-11 ENCOUNTER — Other Ambulatory Visit

## 2024-10-12 ENCOUNTER — Ambulatory Visit: Admitting: Urology
